# Patient Record
Sex: Female | Born: 1980 | Hispanic: Yes | Marital: Married | Smoking: Never smoker
Health system: Southern US, Community
[De-identification: ages and names within clinical notes are randomized; demographics above are authoritative.]

## PROBLEM LIST (undated history)

## (undated) HISTORY — PX: WISDOM TOOTH EXTRACTION: SHX21

## (undated) HISTORY — PX: APPENDECTOMY: SHX54

---

## 2017-05-29 ENCOUNTER — Ambulatory Visit: Payer: 59 | Admitting: Physical Therapy

## 2017-06-04 ENCOUNTER — Ambulatory Visit: Payer: 59 | Attending: Orthopedic Surgery | Admitting: Physical Therapy

## 2017-06-04 DIAGNOSIS — M62830 Muscle spasm of back: Secondary | ICD-10-CM | POA: Diagnosis present

## 2017-06-04 DIAGNOSIS — M545 Low back pain, unspecified: Secondary | ICD-10-CM

## 2017-06-04 NOTE — Therapy (Signed)
[facility] [address] [place], [place], [address] Phone: [phone]   Fax:  [phone]  Physical Therapy Evaluation  Patient Details  Name: Elizabeth Acevedo MRN: [id number] Date of Birth: [birth year] Referring Provider: Elly Modena  Encounter Date: 06/04/2017      PT End of Session - 06/04/17 1559    Visit Number 1   Date for PT Re-Evaluation 08/04/17   PT Start Time 1528   PT Stop Time 1607   PT Time Calculation (min) 39 min   Activity Tolerance Patient tolerated treatment well   Behavior During Therapy [facility] for tasks assessed/performed      No past medical history on file.  No past surgical history on file.  There were no vitals filed for this visit.       Subjective Assessment - 06/04/17 1529    Subjective Patient reports low back pain started about a month ago, she does have a 38 year old that she lifts often.  She saw a chiropractor and this not help, she reports that she started prednisone and baclofen and that helped all of her pain.   Patient Stated Goals be healthy, have no pain   Currently in Pain? Yes   Pain Score 2    Pain Location Back   Pain Orientation Lower   Pain Descriptors / Indicators Sore;Burning   Pain Type Acute pain   Pain Onset More than a month ago   Pain Frequency Intermittent   Aggravating Factors  bending and lifting, driving, pain was up to 32/44   Pain Relieving Factors prednisone and baclofen helped, nothing else helped   Effect of Pain on Daily Activities limits all ADL's            [facility] PT Assessment - 06/04/17 0001      Assessment   Medical Diagnosis LBP   Referring Provider Elly Modena   Prior Therapy saw chiropractor     Precautions   Precautions None     Balance Screen   Has the patient fallen in the past 6 months No   Has the patient had a decrease in activity level because of a fear of falling?  No   Is the patient reluctant to leave their  home because of a fear of falling?  No     Home Environment   Additional Comments has a 38 year old, housework     Prior Function   Level of Independence Independent   Vocation Full time employment   Vocation Requirements sitting at computer all day   Leisure some walking     ROM / Strength   AROM / PROM / Strength AROM;Strength     AROM   Overall AROM Comments Lumbar ROM decreased 25% without pain     Strength   Overall Strength Comments 4/5 without pain     Flexibility   Soft Tissue Assessment /Muscle Length yes   Hamstrings very tight, SLR at 60 degrees   Quadriceps very tight 8 " from buttock heel   Piriformis mild tightness     Palpation   Palpation comment very tight, non tender in the lumbar parapsinals, no rib hump     Transfers   Comments gettting up from supine causes a pinch            Objective measurements completed on examination: See above findings.                  PT  Education - 06/04/17 1557    Education provided Yes   Education Details Wms flexion and LE felxibility   Person(s) Educated Patient   Methods Explanation;Demonstration;Handout   Comprehension Verbalized understanding          PT Short Term Goals - 06/04/17 1605      PT SHORT TERM GOAL #1   Title independent with initial HEP   Time 2   Period Weeks   Status New           PT Long Term Goals - 06/04/17 1605      PT LONG TERM GOAL #1   Title no pain with getting in and out of bed   Time 8   Period Weeks   Status New     PT LONG TERM GOAL #2   Title independent with gym or advanced HEP   Time 8   Period Weeks   Status New     PT LONG TERM GOAL #3   Title understand proper posture and body mechanics   Time 8   Period Weeks   Status New                Plan - 06/04/17 1601    Clinical Impression Statement Patient reports having severe pain about a month ago, she is unsure of a cause but reports that she has a 38 year old that she lifts  often.  She was seeing a chiropractor without any benefit, she reports that she was started on PRednisone and Baclofen, and now she has very little to no pain.  Reports that getting out of bed now hurts in the right low back, ROM is slightly limited, she is very tight in the LE's, the lumbar parapsinals are very tight but non tender., seems to have some curvature of the spine but no rib hump   Clinical Presentation Evolving   Clinical Presentation due to: having severe pain until last week   Clinical Decision Making Low   Rehab Potential Good   PT Frequency 2x / week   PT Duration 8 weeks   PT Treatment/Interventions ADLs/Self Care Home Management;Electrical Stimulation;Traction;Moist Heat;Patient/family education;Therapeutic exercise;Therapeutic activities;Manual techniques   PT Next Visit Plan show proper posture and body mechanics, show gym activities for good core, will need to go slow secondary to having severe pain until 1-2 weeks ago   Consulted and Agree with Plan of Care Patient      Patient will benefit from skilled therapeutic intervention in order to improve the following deficits and impairments:  Decreased strength, Postural dysfunction, Improper body mechanics, Impaired flexibility, Pain, Increased muscle spasms, Decreased range of motion  Visit Diagnosis: Acute bilateral low back pain without sciatica - Plan: PT plan of care cert/re-cert  Muscle spasm of back - Plan: PT plan of care cert/re-cert     Problem List There are no active problems to display for this patient.   Jearld Lesch., PT 06/04/2017, 4:07 PM  [facility] [address], [place], [address] Phone: [phone]   Fax:  [phone]  Name: Elizabeth Acevedo MRN: [id number] Date of Birth: 01/19/1981

## 2017-08-20 ENCOUNTER — Ambulatory Visit (INDEPENDENT_AMBULATORY_CARE_PROVIDER_SITE_OTHER): Payer: 59

## 2017-08-20 ENCOUNTER — Encounter: Payer: Self-pay | Admitting: General Practice

## 2017-08-20 DIAGNOSIS — Z3201 Encounter for pregnancy test, result positive: Secondary | ICD-10-CM

## 2017-08-20 LAB — POCT PREGNANCY, URINE: Preg Test, Ur: POSITIVE — AB

## 2017-08-20 NOTE — Progress Notes (Signed)
Pt here today for pregnancy test.  Resulted positive.  Pt reports exact LMP 05/17/17.  EDD 02/21/18.  Pt medications and allergies verified.  Pt given OB otc medication list.  Front office to provide proof of pregnancy letter to start prenatal care.

## 2017-09-10 LAB — OB RESULTS CONSOLE ABO/RH: RH TYPE: POSITIVE

## 2017-09-10 LAB — OB RESULTS CONSOLE GBS: STREP GROUP B AG: POSITIVE

## 2017-09-10 LAB — OB RESULTS CONSOLE GC/CHLAMYDIA
Chlamydia: NEGATIVE
Gonorrhea: NEGATIVE

## 2017-09-10 LAB — OB RESULTS CONSOLE HIV ANTIBODY (ROUTINE TESTING): HIV: NONREACTIVE

## 2017-09-10 LAB — OB RESULTS CONSOLE ANTIBODY SCREEN: ANTIBODY SCREEN: NEGATIVE

## 2017-09-10 LAB — OB RESULTS CONSOLE RUBELLA ANTIBODY, IGM: Rubella: IMMUNE

## 2017-09-10 LAB — OB RESULTS CONSOLE HEPATITIS B SURFACE ANTIGEN: Hepatitis B Surface Ag: NEGATIVE

## 2017-09-10 LAB — OB RESULTS CONSOLE RPR: RPR: NONREACTIVE

## 2018-01-04 ENCOUNTER — Other Ambulatory Visit: Payer: Self-pay | Admitting: Obstetrics and Gynecology

## 2018-02-14 ENCOUNTER — Encounter (HOSPITAL_COMMUNITY): Payer: Self-pay | Admitting: *Deleted

## 2018-02-15 ENCOUNTER — Encounter (HOSPITAL_COMMUNITY): Payer: Self-pay

## 2018-02-27 NOTE — Patient Instructions (Signed)
Shalayne Betancourt  02/27/2018   Your procedure is scheduled on:  03/04/2018  Enter through the Main Entrance of [facility] at 0930 AM.  Pick up the phone at the desk and dial [phone]  Call this number if you have problems the morning of surgery:[phone]  Remember:   Do not eat food:(After Midnight) Desps de medianoche.  Do not drink clear liquids: (After Midnight) Desps de medianoche.  Take these medicines the morning of surgery with A SIP OF WATER: none   Do not wear jewelry, make-up or nail polish.  Do not wear lotions, powders, or perfumes. Do not wear deodorant.  Do not shave 48 hours prior to surgery.  Do not bring valuables to the hospital.  [facility] is not   responsible for any belongings or valuables brought to the hospital.  Contacts, dentures or bridgework may not be worn into surgery.  Leave suitcase in the car. After surgery it may be brought to your room.  For patients admitted to the hospital, checkout time is 11:00 AM the day of              discharge.    N/A   Please read over the following fact sheets that you were given:   Surgical Site Infection Prevention

## 2018-03-01 ENCOUNTER — Encounter (HOSPITAL_COMMUNITY)
Admission: RE | Admit: 2018-03-01 | Discharge: 2018-03-01 | Disposition: A | Discharge status: Home / Self Care | Payer: 59 | Source: Ambulatory Visit | Attending: Obstetrics and Gynecology | Admitting: Obstetrics and Gynecology

## 2018-03-01 LAB — CBC
HEMATOCRIT: 34.5 % — AB (ref 36.0–46.0)
HEMOGLOBIN: 11.2 g/dL — AB (ref 12.0–15.0)
MCH: 27.8 pg (ref 26.0–34.0)
MCHC: 32.5 g/dL (ref 30.0–36.0)
MCV: 85.6 fL (ref 78.0–100.0)
Platelets: 179 10*3/uL (ref 150–400)
RBC: 4.03 MIL/uL (ref 3.87–5.11)
RDW: 15.5 % (ref 11.5–15.5)
WBC: 8.4 10*3/uL (ref 4.0–10.5)

## 2018-03-01 LAB — ABO/RH: ABO/RH(D): O POS

## 2018-03-02 LAB — RPR: RPR: NONREACTIVE

## 2018-03-04 ENCOUNTER — Other Ambulatory Visit: Payer: Self-pay

## 2018-03-04 ENCOUNTER — Inpatient Hospital Stay (HOSPITAL_COMMUNITY)
Admission: AD | Admit: 2018-03-04 | Discharge: 2018-03-07 | DRG: 784 | Disposition: A | Discharge status: Home / Self Care | Payer: 59 | Attending: Obstetrics and Gynecology | Admitting: Obstetrics and Gynecology

## 2018-03-04 ENCOUNTER — Inpatient Hospital Stay (HOSPITAL_COMMUNITY): Payer: 59 | Admitting: Anesthesiology

## 2018-03-04 ENCOUNTER — Encounter (HOSPITAL_COMMUNITY)
Admission: AD | Disposition: A | Discharge status: Home / Self Care | Payer: Self-pay | Source: Home / Self Care | Attending: Obstetrics and Gynecology

## 2018-03-04 ENCOUNTER — Encounter (HOSPITAL_COMMUNITY): Payer: Self-pay | Admitting: Obstetrics

## 2018-03-04 DIAGNOSIS — O99824 Streptococcus B carrier state complicating childbirth: Secondary | ICD-10-CM | POA: Diagnosis present

## 2018-03-04 DIAGNOSIS — Z302 Encounter for sterilization: Secondary | ICD-10-CM

## 2018-03-04 DIAGNOSIS — O34211 Maternal care for low transverse scar from previous cesarean delivery: Principal | ICD-10-CM | POA: Diagnosis present

## 2018-03-04 DIAGNOSIS — Z3A39 39 weeks gestation of pregnancy: Secondary | ICD-10-CM

## 2018-03-04 DIAGNOSIS — Z98891 History of uterine scar from previous surgery: Secondary | ICD-10-CM

## 2018-03-04 HISTORY — PX: TUBAL LIGATION: SHX77

## 2018-03-04 LAB — POCT I-STAT EG7
Acid-base deficit: 4 mmol/L — ABNORMAL HIGH (ref 0.0–2.0)
BICARBONATE: 22.2 mmol/L (ref 20.0–28.0)
Calcium, Ion: 1.22 mmol/L (ref 1.15–1.40)
HCT: 34 % — ABNORMAL LOW (ref 36.0–46.0)
HEMOGLOBIN: 11.6 g/dL — AB (ref 12.0–15.0)
O2 Saturation: 100 %
POTASSIUM: 4.6 mmol/L (ref 3.5–5.1)
Sodium: 135 mmol/L (ref 135–145)
TCO2: 24 mmol/L (ref 22–32)
pCO2, Ven: 42.9 mmHg — ABNORMAL LOW (ref 44.0–60.0)
pH, Ven: 7.322 (ref 7.250–7.430)
pO2, Ven: 261 mmHg — ABNORMAL HIGH (ref 32.0–45.0)

## 2018-03-04 LAB — CBC
HCT: 35.3 % — ABNORMAL LOW (ref 36.0–46.0)
HCT: 34.9 % — ABNORMAL LOW (ref 36.0–46.0)
HEMOGLOBIN: 11.3 g/dL — AB (ref 12.0–15.0)
Hemoglobin: 11.4 g/dL — ABNORMAL LOW (ref 12.0–15.0)
MCH: 27.3 pg (ref 26.0–34.0)
MCH: 27.7 pg (ref 26.0–34.0)
MCHC: 32 g/dL (ref 30.0–36.0)
MCHC: 32.7 g/dL (ref 30.0–36.0)
MCV: 85.3 fL (ref 78.0–100.0)
MCV: 84.7 fL (ref 78.0–100.0)
PLATELETS: 159 10*3/uL (ref 150–400)
Platelets: 161 10*3/uL (ref 150–400)
RBC: 4.14 MIL/uL (ref 3.87–5.11)
RBC: 4.12 MIL/uL (ref 3.87–5.11)
RDW: 15.4 % (ref 11.5–15.5)
RDW: 15.2 % (ref 11.5–15.5)
WBC: 12.2 10*3/uL — ABNORMAL HIGH (ref 4.0–10.5)
WBC: 16.4 10*3/uL — ABNORMAL HIGH (ref 4.0–10.5)

## 2018-03-04 LAB — PREPARE RBC (CROSSMATCH)

## 2018-03-04 LAB — PROTIME-INR
INR: 0.97
PROTHROMBIN TIME: 12.8 s (ref 11.4–15.2)

## 2018-03-04 SURGERY — Surgical Case
Anesthesia: Spinal | Site: Abdomen | Wound class: Clean Contaminated

## 2018-03-04 MED ORDER — METOCLOPRAMIDE HCL 5 MG/ML IJ SOLN
INTRAMUSCULAR | Status: DC | PRN
  Administered 2018-03-04: 10 mg via INTRAVENOUS

## 2018-03-04 MED ORDER — OXYTOCIN 10 UNIT/ML IJ SOLN
INTRAMUSCULAR | Status: AC
  Filled 2018-03-04: qty 4

## 2018-03-04 MED ORDER — NALBUPHINE HCL 10 MG/ML IJ SOLN
INTRAMUSCULAR | Status: AC
  Filled 2018-03-04: qty 1

## 2018-03-04 MED ORDER — CEFAZOLIN SODIUM-DEXTROSE 1-4 GM-%(50ML) IV SOLR: 2.0000 g | Freq: Once | INTRAVENOUS | Status: DC

## 2018-03-04 MED ORDER — METOCLOPRAMIDE HCL 5 MG/ML IJ SOLN
INTRAMUSCULAR | Status: AC
  Filled 2018-03-04: qty 2

## 2018-03-04 MED ORDER — PROMETHAZINE HCL 25 MG/ML IJ SOLN: 6.2500 mg | INTRAMUSCULAR | Status: DC | PRN

## 2018-03-04 MED ORDER — LACTATED RINGERS IV SOLN
INTRAVENOUS | Status: DC
  Administered 2018-03-04 (×4): via INTRAVENOUS

## 2018-03-04 MED ORDER — OXYCODONE-ACETAMINOPHEN 5-325 MG PO TABS
1.0000 | ORAL_TABLET | ORAL | Status: DC | PRN
  Administered 2018-03-05 – 2018-03-06 (×4): 1 via ORAL
  Filled 2018-03-04 (×2): qty 1

## 2018-03-04 MED ORDER — CEFAZOLIN SODIUM-DEXTROSE 2-4 GM/100ML-% IV SOLN
2.0000 g | INTRAVENOUS | Status: AC
  Administered 2018-03-04: 2 g via INTRAVENOUS
  Filled 2018-03-04: qty 100

## 2018-03-04 MED ORDER — ZOLPIDEM TARTRATE 5 MG PO TABS: 5.0000 mg | ORAL_TABLET | Freq: Every evening | ORAL | Status: DC | PRN

## 2018-03-04 MED ORDER — ROCURONIUM BROMIDE 100 MG/10ML IV SOLN
INTRAVENOUS | Status: AC
Start: 2018-03-04 — End: ?
  Filled 2018-03-04: qty 1

## 2018-03-04 MED ORDER — SCOPOLAMINE 1 MG/3DAYS TD PT72
MEDICATED_PATCH | TRANSDERMAL | Status: AC
  Administered 2018-03-04: 1.5 mg via TRANSDERMAL
  Filled 2018-03-04: qty 1

## 2018-03-04 MED ORDER — ONDANSETRON HCL 4 MG/2ML IJ SOLN
INTRAMUSCULAR | Status: DC | PRN
  Administered 2018-03-04: 4 mg via INTRAVENOUS

## 2018-03-04 MED ORDER — ACETAMINOPHEN 10 MG/ML IV SOLN
INTRAVENOUS | Status: AC
  Administered 2018-03-04: 1000 mg
  Filled 2018-03-04: qty 100

## 2018-03-04 MED ORDER — SUCCINYLCHOLINE CHLORIDE 20 MG/ML IJ SOLN
INTRAMUSCULAR | Status: DC | PRN
  Administered 2018-03-04: 100 mg via INTRAVENOUS

## 2018-03-04 MED ORDER — METHYLERGONOVINE MALEATE 0.2 MG/ML IJ SOLN
INTRAMUSCULAR | Status: DC | PRN
  Administered 2018-03-04: 0.2 mg via INTRAMUSCULAR

## 2018-03-04 MED ORDER — ONDANSETRON HCL 4 MG/2ML IJ SOLN
INTRAMUSCULAR | Status: AC
  Filled 2018-03-04: qty 2

## 2018-03-04 MED ORDER — ACETAMINOPHEN 10 MG/ML IV SOLN
1000.0000 mg | Freq: Four times a day (QID) | INTRAVENOUS | Status: DC
  Filled 2018-03-04 (×4): qty 100

## 2018-03-04 MED ORDER — SODIUM CHLORIDE 0.9% FLUSH
INTRAVENOUS | Status: AC
  Filled 2018-03-04: qty 3

## 2018-03-04 MED ORDER — CARBOPROST TROMETHAMINE 250 MCG/ML IM SOLN
INTRAMUSCULAR | Status: AC
  Filled 2018-03-04: qty 1

## 2018-03-04 MED ORDER — OXYTOCIN 10 UNIT/ML IJ SOLN
INTRAVENOUS | Status: DC | PRN
  Administered 2018-03-04: 40 [IU] via INTRAVENOUS

## 2018-03-04 MED ORDER — SUGAMMADEX SODIUM 500 MG/5ML IV SOLN
INTRAVENOUS | Status: AC
  Filled 2018-03-04: qty 5

## 2018-03-04 MED ORDER — COCONUT OIL OIL: 1.0000 "application " | TOPICAL_OIL | Status: DC | PRN

## 2018-03-04 MED ORDER — PROPOFOL 10 MG/ML IV BOLUS
INTRAVENOUS | Status: DC | PRN
  Administered 2018-03-04: 200 mg via INTRAVENOUS

## 2018-03-04 MED ORDER — BUPIVACAINE IN DEXTROSE 0.75-8.25 % IT SOLN
INTRATHECAL | Status: DC | PRN
  Administered 2018-03-04: 1.4 mL via INTRATHECAL

## 2018-03-04 MED ORDER — MORPHINE SULFATE (PF) 4 MG/ML IV SOLN
INTRAVENOUS | Status: AC
  Filled 2018-03-04: qty 1

## 2018-03-04 MED ORDER — MORPHINE SULFATE (PF) 0.5 MG/ML IJ SOLN
INTRAMUSCULAR | Status: AC
Start: 2018-03-04 — End: ?
  Filled 2018-03-04: qty 10

## 2018-03-04 MED ORDER — OXYTOCIN 40 UNITS IN LACTATED RINGERS INFUSION - SIMPLE MED: 2.5000 [IU]/h | INTRAVENOUS | Status: AC

## 2018-03-04 MED ORDER — BUPIVACAINE HCL (PF) 0.5 % IJ SOLN
30.0000 mL | Freq: Once | INTRAMUSCULAR | Status: DC
  Filled 2018-03-04: qty 30

## 2018-03-04 MED ORDER — SUCCINYLCHOLINE CHLORIDE 200 MG/10ML IV SOSY
PREFILLED_SYRINGE | INTRAVENOUS | Status: AC
  Filled 2018-03-04: qty 10

## 2018-03-04 MED ORDER — SUGAMMADEX SODIUM 200 MG/2ML IV SOLN
INTRAVENOUS | Status: DC | PRN
  Administered 2018-03-04: 150 mg via INTRAVENOUS

## 2018-03-04 MED ORDER — LACTATED RINGERS IV SOLN: INTRAVENOUS | Status: DC

## 2018-03-04 MED ORDER — METHYLERGONOVINE MALEATE 0.2 MG PO TABS
ORAL_TABLET | ORAL | Status: AC
  Filled 2018-03-04: qty 1

## 2018-03-04 MED ORDER — FENTANYL CITRATE (PF) 100 MCG/2ML IJ SOLN
INTRAMUSCULAR | Status: AC
Start: 2018-03-04 — End: ?
  Filled 2018-03-04: qty 2

## 2018-03-04 MED ORDER — DIBUCAINE 1 % RE OINT: 1.0000 "application " | TOPICAL_OINTMENT | RECTAL | Status: DC | PRN

## 2018-03-04 MED ORDER — MIDAZOLAM HCL 2 MG/2ML IJ SOLN: 0.5000 mg | Freq: Once | INTRAMUSCULAR | Status: DC | PRN

## 2018-03-04 MED ORDER — SENNOSIDES-DOCUSATE SODIUM 8.6-50 MG PO TABS
2.0000 | ORAL_TABLET | ORAL | Status: DC
  Administered 2018-03-04 – 2018-03-07 (×3): 2 via ORAL
  Filled 2018-03-04 (×3): qty 2

## 2018-03-04 MED ORDER — DEXAMETHASONE SODIUM PHOSPHATE 10 MG/ML IJ SOLN
INTRAMUSCULAR | Status: AC
  Filled 2018-03-04: qty 1

## 2018-03-04 MED ORDER — PHENYLEPHRINE 8 MG IN D5W 100 ML (0.08MG/ML) PREMIX OPTIME
INJECTION | INTRAVENOUS | Status: AC
  Filled 2018-03-04: qty 100

## 2018-03-04 MED ORDER — IBUPROFEN 600 MG PO TABS
600.0000 mg | ORAL_TABLET | Freq: Four times a day (QID) | ORAL | Status: DC
  Administered 2018-03-04 – 2018-03-06 (×6): 600 mg via ORAL
  Filled 2018-03-04 (×6): qty 1

## 2018-03-04 MED ORDER — ROCURONIUM BROMIDE 100 MG/10ML IV SOLN
INTRAVENOUS | Status: DC | PRN
  Administered 2018-03-04: 30 mg via INTRAVENOUS

## 2018-03-04 MED ORDER — DIPHENHYDRAMINE HCL 25 MG PO CAPS: 25.0000 mg | ORAL_CAPSULE | Freq: Four times a day (QID) | ORAL | Status: DC | PRN

## 2018-03-04 MED ORDER — MORPHINE SULFATE (PF) 4 MG/ML IV SOLN
1.0000 mg | INTRAVENOUS | Status: DC | PRN
  Administered 2018-03-04: 1 mg via INTRAVENOUS
  Administered 2018-03-04: 2 mg via INTRAVENOUS

## 2018-03-04 MED ORDER — FENTANYL CITRATE (PF) 100 MCG/2ML IJ SOLN
INTRAMUSCULAR | Status: AC
  Filled 2018-03-04: qty 2

## 2018-03-04 MED ORDER — ALBUMIN HUMAN 5 % IV SOLN
INTRAVENOUS | Status: DC | PRN
  Administered 2018-03-04: 14:00:00 via INTRAVENOUS

## 2018-03-04 MED ORDER — SCOPOLAMINE 1 MG/3DAYS TD PT72
1.0000 | MEDICATED_PATCH | Freq: Once | TRANSDERMAL | Status: DC
  Administered 2018-03-04: 1.5 mg via TRANSDERMAL

## 2018-03-04 MED ORDER — MENTHOL 3 MG MT LOZG: 1.0000 | LOZENGE | OROMUCOSAL | Status: DC | PRN

## 2018-03-04 MED ORDER — SODIUM CHLORIDE 0.9 % IR SOLN
Status: DC | PRN
  Administered 2018-03-04: 500 mL
  Administered 2018-03-04: 1000 mL

## 2018-03-04 MED ORDER — PROPOFOL 10 MG/ML IV BOLUS
INTRAVENOUS | Status: AC
  Filled 2018-03-04: qty 20

## 2018-03-04 MED ORDER — SIMETHICONE 80 MG PO CHEW
80.0000 mg | CHEWABLE_TABLET | Freq: Three times a day (TID) | ORAL | Status: DC
  Administered 2018-03-04 – 2018-03-07 (×6): 80 mg via ORAL
  Filled 2018-03-04 (×7): qty 1

## 2018-03-04 MED ORDER — CARBOPROST TROMETHAMINE 250 MCG/ML IM SOLN
INTRAMUSCULAR | Status: DC | PRN
  Administered 2018-03-04: 250 ug via INTRAMUSCULAR

## 2018-03-04 MED ORDER — NALBUPHINE HCL 10 MG/ML IJ SOLN
5.0000 mg | INTRAMUSCULAR | Status: DC | PRN
  Administered 2018-03-04: 5 mg via INTRAVENOUS

## 2018-03-04 MED ORDER — KETOROLAC TROMETHAMINE 30 MG/ML IJ SOLN: 30.0000 mg | Freq: Once | INTRAMUSCULAR | Status: DC | PRN

## 2018-03-04 MED ORDER — OXYCODONE-ACETAMINOPHEN 5-325 MG PO TABS
2.0000 | ORAL_TABLET | ORAL | Status: DC | PRN
  Administered 2018-03-05: 2 via ORAL
  Filled 2018-03-04 (×3): qty 2

## 2018-03-04 MED ORDER — LACTATED RINGERS IV SOLN
INTRAVENOUS | Status: DC | PRN
  Administered 2018-03-04: 14:00:00 via INTRAVENOUS

## 2018-03-04 MED ORDER — BUPIVACAINE HCL (PF) 0.5 % IJ SOLN
INTRAMUSCULAR | Status: AC
  Filled 2018-03-04: qty 30

## 2018-03-04 MED ORDER — ACETAMINOPHEN 325 MG PO TABS: 650.0000 mg | ORAL_TABLET | ORAL | Status: DC | PRN

## 2018-03-04 MED ORDER — SOD CITRATE-CITRIC ACID 500-334 MG/5ML PO SOLN
ORAL | Status: AC
  Administered 2018-03-04: 30 mL via ORAL
  Filled 2018-03-04: qty 15

## 2018-03-04 MED ORDER — CEFAZOLIN SODIUM-DEXTROSE 2-4 GM/100ML-% IV SOLN
2.0000 g | Freq: Once | INTRAVENOUS | Status: AC
  Administered 2018-03-04: 2 g via INTRAVENOUS
  Filled 2018-03-04: qty 100

## 2018-03-04 MED ORDER — SIMETHICONE 80 MG PO CHEW
80.0000 mg | CHEWABLE_TABLET | ORAL | Status: DC
  Administered 2018-03-04 – 2018-03-05 (×2): 80 mg via ORAL
  Filled 2018-03-04 (×2): qty 1

## 2018-03-04 MED ORDER — DEXAMETHASONE SODIUM PHOSPHATE 10 MG/ML IJ SOLN
INTRAMUSCULAR | Status: DC | PRN
  Administered 2018-03-04: 10 mg via INTRAVENOUS

## 2018-03-04 MED ORDER — METHYLERGONOVINE MALEATE 0.2 MG PO TABS
0.2000 mg | ORAL_TABLET | Freq: Four times a day (QID) | ORAL | Status: AC
  Administered 2018-03-04 – 2018-03-05 (×4): 0.2 mg via ORAL
  Filled 2018-03-04 (×3): qty 1

## 2018-03-04 MED ORDER — MORPHINE SULFATE (PF) 0.5 MG/ML IJ SOLN
INTRAMUSCULAR | Status: DC | PRN
  Administered 2018-03-04: .2 mg via INTRATHECAL

## 2018-03-04 MED ORDER — ALBUMIN HUMAN 5 % IV SOLN
INTRAVENOUS | Status: AC
  Filled 2018-03-04: qty 250

## 2018-03-04 MED ORDER — WITCH HAZEL-GLYCERIN EX PADS: 1.0000 "application " | MEDICATED_PAD | CUTANEOUS | Status: DC | PRN

## 2018-03-04 MED ORDER — PRENATAL MULTIVITAMIN CH
1.0000 | ORAL_TABLET | Freq: Every day | ORAL | Status: DC
  Administered 2018-03-05 – 2018-03-06 (×2): 1 via ORAL
  Filled 2018-03-04 (×2): qty 1

## 2018-03-04 MED ORDER — BUPIVACAINE HCL (PF) 0.5 % IJ SOLN
INTRAMUSCULAR | Status: DC | PRN
  Administered 2018-03-04: 30 mL

## 2018-03-04 MED ORDER — OXYTOCIN 40 UNITS IN LACTATED RINGERS INFUSION - SIMPLE MED
INTRAVENOUS | Status: DC | PRN
  Administered 2018-03-04 (×2): 100 mL via INTRAVENOUS
  Administered 2018-03-04: 900 mL via INTRAVENOUS
  Administered 2018-03-04: 100 mL via INTRAVENOUS

## 2018-03-04 MED ORDER — MORPHINE SULFATE-NACL 0.5-0.9 MG/ML-% IV SOSY: PREFILLED_SYRINGE | INTRAVENOUS | Status: DC | PRN

## 2018-03-04 MED ORDER — TETANUS-DIPHTH-ACELL PERTUSSIS 5-2.5-18.5 LF-MCG/0.5 IM SUSP: 0.5000 mL | Freq: Once | INTRAMUSCULAR | Status: DC

## 2018-03-04 MED ORDER — SIMETHICONE 80 MG PO CHEW: 80.0000 mg | CHEWABLE_TABLET | ORAL | Status: DC | PRN

## 2018-03-04 MED ORDER — SOD CITRATE-CITRIC ACID 500-334 MG/5ML PO SOLN
30.0000 mL | Freq: Once | ORAL | Status: AC
  Administered 2018-03-04: 30 mL via ORAL

## 2018-03-04 MED ORDER — PHENYLEPHRINE 8 MG IN D5W 100 ML (0.08MG/ML) PREMIX OPTIME
INJECTION | INTRAVENOUS | Status: DC | PRN
  Administered 2018-03-04: 60 ug/min via INTRAVENOUS

## 2018-03-04 MED ORDER — MEPERIDINE HCL 25 MG/ML IJ SOLN: 6.2500 mg | INTRAMUSCULAR | Status: DC | PRN

## 2018-03-04 MED ORDER — TRANEXAMIC ACID 1000 MG/10ML IV SOLN
1000.0000 mg | INTRAVENOUS | Status: AC
  Administered 2018-03-04: 1000 mg via INTRAVENOUS
  Filled 2018-03-04: qty 10

## 2018-03-04 MED ORDER — SODIUM CHLORIDE 0.9% IV SOLUTION: Freq: Once | INTRAVENOUS | Status: DC

## 2018-03-04 MED ORDER — FENTANYL CITRATE (PF) 100 MCG/2ML IJ SOLN
INTRAMUSCULAR | Status: DC | PRN
  Administered 2018-03-04: 40 ug via INTRAVENOUS
  Administered 2018-03-04: 100 ug via INTRAVENOUS
  Administered 2018-03-04: 50 ug via INTRAVENOUS
  Administered 2018-03-04: 10 ug via INTRATHECAL

## 2018-03-04 SURGICAL SUPPLY — 59 items
BENZOIN TINCTURE PRP APPL 2/3 (GAUZE/BANDAGES/DRESSINGS) ×4 IMPLANT
CHLORAPREP W/TINT 26ML (MISCELLANEOUS) ×4 IMPLANT
CLAMP CORD UMBIL (MISCELLANEOUS) IMPLANT
CLOSURE STERI STRIP 1/2 X4 (GAUZE/BANDAGES/DRESSINGS) ×4 IMPLANT
CLOSURE WOUND 1/2 X4 (GAUZE/BANDAGES/DRESSINGS) ×2
CLOTH BEACON ORANGE TIMEOUT ST (SAFETY) ×4 IMPLANT
COUNTER NEEDLE 1200 MAGNETIC (NEEDLE) ×4 IMPLANT
DECANTER SPIKE VIAL GLASS SM (MISCELLANEOUS) ×4 IMPLANT
DRAIN JACKSON PRT FLT 10 (DRAIN) IMPLANT
DRSG OPSITE POSTOP 4X10 (GAUZE/BANDAGES/DRESSINGS) ×4 IMPLANT
ELECT REM PT RETURN 9FT ADLT (ELECTROSURGICAL) ×4
ELECTRODE REM PT RTRN 9FT ADLT (ELECTROSURGICAL) ×2 IMPLANT
EVACUATOR SILICONE 100CC (DRAIN) IMPLANT
EXTRACTOR VACUUM M CUP 4 TUBE (SUCTIONS) IMPLANT
EXTRACTOR VACUUM M CUP 4' TUBE (SUCTIONS)
GAUZE SPONGE 4X4 12PLY STRL LF (GAUZE/BANDAGES/DRESSINGS) ×8 IMPLANT
GLOVE BIO SURGEON STRL SZ 6.5 (GLOVE) ×6 IMPLANT
GLOVE BIO SURGEON STRL SZ7.5 (GLOVE) ×4 IMPLANT
GLOVE BIO SURGEONS STRL SZ 6.5 (GLOVE) ×2
GLOVE BIOGEL PI IND STRL 7.0 (GLOVE) ×6 IMPLANT
GLOVE BIOGEL PI INDICATOR 7.0 (GLOVE) ×6
GOWN STRL REUS W/TWL LRG LVL3 (GOWN DISPOSABLE) ×8 IMPLANT
HEMOSTAT ARISTA ABSORB 3G PWDR (MISCELLANEOUS) ×4 IMPLANT
IV SOD CHL 0.9% 1000ML (IV SOLUTION) ×12 IMPLANT
KIT ABG SYR 3ML LUER SLIP (SYRINGE) IMPLANT
NDL SAFETY ECLIPSE 18X1.5 (NEEDLE) ×2 IMPLANT
NEEDLE HYPO 18GX1.5 SHARP (NEEDLE) ×2
NEEDLE HYPO 25X5/8 SAFETYGLIDE (NEEDLE) IMPLANT
NS IRRIG 1000ML POUR BTL (IV SOLUTION) ×4 IMPLANT
PACK C SECTION WH (CUSTOM PROCEDURE TRAY) ×4 IMPLANT
PAD ABD 7.5X8 STRL (GAUZE/BANDAGES/DRESSINGS) ×4 IMPLANT
PAD ABD 8X10 STRL (GAUZE/BANDAGES/DRESSINGS) ×4 IMPLANT
PAD OB MATERNITY 4.3X12.25 (PERSONAL CARE ITEMS) ×4 IMPLANT
PENCIL SMOKE EVAC W/HOLSTER (ELECTROSURGICAL) ×4 IMPLANT
RTRCTR C-SECT PINK 25CM LRG (MISCELLANEOUS) IMPLANT
SPONGE GAUZE 4X4 16PLY NONSTR (GAUZE/BANDAGES/DRESSINGS) ×8 IMPLANT
SPONGE LAP 18X18 X RAY DECT (DISPOSABLE) ×20 IMPLANT
STRIP CLOSURE SKIN 1/2X4 (GAUZE/BANDAGES/DRESSINGS) ×6 IMPLANT
SUT CHROMIC 0 CT 1 (SUTURE) ×12 IMPLANT
SUT CHROMIC 2 0 CT 1 (SUTURE) ×8 IMPLANT
SUT MNCRL AB 3-0 PS2 27 (SUTURE) ×4 IMPLANT
SUT PLAIN 2 0 (SUTURE) ×4
SUT PLAIN 2 0 XLH (SUTURE) ×4 IMPLANT
SUT PLAIN ABS 2-0 CT1 27XMFL (SUTURE) ×4 IMPLANT
SUT SILK 2 0 SH (SUTURE) IMPLANT
SUT VIC AB 0 CT1 18XCR BRD8 (SUTURE) ×2 IMPLANT
SUT VIC AB 0 CT1 27 (SUTURE) ×16
SUT VIC AB 0 CT1 27XBRD ANBCTR (SUTURE) ×16 IMPLANT
SUT VIC AB 0 CT1 36 (SUTURE) ×4 IMPLANT
SUT VIC AB 0 CT1 8-18 (SUTURE) ×2
SUT VIC AB 0 CTX 36 (SUTURE) ×20
SUT VIC AB 0 CTX36XBRD ANBCTRL (SUTURE) ×20 IMPLANT
SUT VIC AB 2-0 CT1 (SUTURE) ×12 IMPLANT
SUT VIC AB 2-0 SH 27 (SUTURE) ×12
SUT VIC AB 2-0 SH 27XBRD (SUTURE) ×12 IMPLANT
SUT VICRYL 0 TIES 12 18 (SUTURE) ×4 IMPLANT
SYR 10ML LL (SYRINGE) ×4 IMPLANT
TOWEL OR 17X24 6PK STRL BLUE (TOWEL DISPOSABLE) ×4 IMPLANT
TRAY FOLEY W/BAG SLVR 14FR LF (SET/KITS/TRAYS/PACK) ×4 IMPLANT

## 2018-03-04 NOTE — Anesthesia Postprocedure Evaluation (Signed)
Anesthesia Post Note  Patient: Shelda Betancourt-Serrano  Procedure(s) Performed: REPEAT CESAREAN SECTION (N/A Abdomen) BILATERAL TUBAL LIGATION (Bilateral Abdomen)     Patient location during evaluation: PACU Anesthesia Type: Spinal and General Level of consciousness: oriented, sedated and patient cooperative Pain management: pain level controlled Vital Signs Assessment: post-procedure vital signs reviewed and stable Respiratory status: spontaneous breathing, nonlabored ventilation, respiratory function stable and patient connected to nasal cannula oxygen Cardiovascular status: blood pressure returned to baseline and stable Postop Assessment: no apparent nausea or vomiting Anesthetic complications: no    Last Vitals:  Vitals:   03/04/18 1645 03/04/18 1700  BP: 103/73 110/73  Pulse: 67 81  Resp: 15 16  Temp:    SpO2: 97% 96%    Last Pain:  Vitals:   03/04/18 1700  TempSrc:   PainSc: 3    Pain Goal:    LLE Motor Response: Purposeful movement (03/04/18 1700) LLE Sensation: Tingling (03/04/18 1700) RLE Motor Response: Purposeful movement (03/04/18 1700) RLE Sensation: Tingling (03/04/18 1700)      Elsie Sakuma,E. Edin Skarda

## 2018-03-04 NOTE — H&P (Signed)
Elizabeth Acevedo is a 37 y.o. female G3P2002  677w0d  presenting for Repeat C/S  With bilateral tubal ligation OB History    Gravida  3   Para  2   Term  2   Preterm      AB      Living  2     SAB      TAB      Ectopic      Multiple      Live Births  2          No past medical history on file. Past Surgical History:  Procedure Laterality Date  . APPENDECTOMY    . CESAREAN SECTION    . WISDOM TOOTH EXTRACTION     Family History: family history is not on file. Social History:  reports that she has never smoked. She has never used smokeless tobacco. She reports that she does not drink alcohol or use drugs.     Maternal Diabetes: No Genetic Screening: Normal Maternal Ultrasounds/Referrals: Normal Fetal Ultrasounds or other Referrals:  None Maternal Substance Abuse:  No Significant Maternal Medications:  None Significant Maternal Lab Results:  Lab values include: Group B Strep positive Other Comments:  None  Review of Systems  All other systems reviewed and are negative.  Maternal Medical History:  Fetal activity: Perceived fetal activity is normal.    Prenatal Complications - Diabetes: none.      Blood pressure 98/63, pulse 88, temperature 97.9 F (36.6 C), temperature source Oral, resp. rate 20, height 5' (1.524 m), weight 170 lb 1.6 oz (77.2 kg), last menstrual period 06/04/2017. Maternal Exam:  Uterine Assessment: Contraction frequency is rare.   Abdomen: Patient reports no abdominal tenderness. Fundal height is 7 pounds.   Fetal presentation: vertex  Introitus: not evaluated.   Cervix: not evaluated.   Physical Exam  Nursing note and vitals reviewed. Constitutional: She is oriented to person, place, and time. She appears well-developed and well-nourished. No distress.  HENT:  Head: Normocephalic and atraumatic.  Neck: Normal range of motion.  Cardiovascular: Normal rate.  Respiratory: Effort normal and breath sounds normal.   GI: Soft.  Neurological: She is alert and oriented to person, place, and time.  Skin: Skin is warm and dry.  Psychiatric: She has a normal mood and affect. Her behavior is normal. Thought content normal.    Prenatal labs: ABO, Rh: --/--/O POS, O POS Performed at [facility], [address] [place], [place] [address]  [phone](06/28 1120) Antibody: NEG (06/28 1120) Rubella: Immune (01/07 0000) RPR: Non Reactive (06/28 1120)  HBsAg: Negative (01/07 0000)  HIV: Non-reactive (01/07 0000)  GBS: Positive (01/07 0000)   Assessment/Plan: Scheduled Repeat C/S with Bilateral Tubal Ligation Routine Orders Declines circumcision for boy baby    Lawson FiscalLori A Clemmons CNM 03/04/2018, 11:45 AM

## 2018-03-04 NOTE — Anesthesia Preprocedure Evaluation (Addendum)
Anesthesia Evaluation  Patient identified by MRN, date of birth, ID band Patient awake    Reviewed: Allergy & Precautions, NPO status , Patient's Chart, lab work & pertinent test results  History of Anesthesia Complications Negative for: history of anesthetic complications  Airway Mallampati: II  TM Distance: >3 FB Neck ROM: Full    Dental  (+) Dental Advisory Given   Pulmonary neg pulmonary ROS,    breath sounds clear to auscultation       Cardiovascular negative cardio ROS   Rhythm:Regular Rate:Normal     Neuro/Psych negative neurological ROS     GI/Hepatic Neg liver ROS, GERD  ,  Endo/Other  negative endocrine ROS  Renal/GU negative Renal ROS     Musculoskeletal   Abdominal   Peds  Hematology plt 179k   Anesthesia Other Findings   Reproductive/Obstetrics (+) Pregnancy                            Anesthesia Physical Anesthesia Plan  ASA: II  Anesthesia Plan: Spinal   Post-op Pain Management:    Induction:   PONV Risk Score and Plan: 2 and Treatment may vary due to age or medical condition, Ondansetron, Dexamethasone and Scopolamine patch - Pre-op  Airway Management Planned: Natural Airway  Additional Equipment:   Intra-op Plan:   Post-operative Plan:   Informed Consent: I have reviewed the patients History and Physical, chart, labs and discussed the procedure including the risks, benefits and alternatives for the proposed anesthesia with the patient or authorized representative who has indicated his/her understanding and acceptance.   Dental advisory given  Plan Discussed with: CRNA and Surgeon  Anesthesia Plan Comments: (Plan routine monitors, SAB)       Anesthesia Quick Evaluation

## 2018-03-04 NOTE — Anesthesia Procedure Notes (Signed)
Spinal  Patient location during procedure: OR End time: 03/04/2018 12:25 PM Staffing Anesthesiologist: Jairo BenJackson, Sonu Kruckenberg, MD Performed: anesthesiologist  Preanesthetic Checklist Completed: patient identified, surgical consent, pre-op evaluation, timeout performed, IV checked, risks and benefits discussed and monitors and equipment checked Spinal Block Patient position: sitting Prep: site prepped and draped and DuraPrep Patient monitoring: blood pressure, continuous pulse ox, cardiac monitor and heart rate Approach: midline Location: L4-5 Injection technique: single-shot Needle Needle type: Pencan  Needle gauge: 24 G Needle length: 9 cm Additional Notes Pt identified in Operating room.  Monitors applied. Working IV access confirmed. Sterile prep, drape lumbar spine.  1% lido local L 3,4.  #24ga Pencan into clear CSF L 3,4.  10.5mg  0.75% Bupivacaine with dextrose, fentanyl, morphine injected with asp CSF beginning and end of injection.  Patient asymptomatic, VSS, no heme aspirated, tolerated well.  Sandford Craze Trayvon Trumbull, MD

## 2018-03-04 NOTE — Anesthesia Procedure Notes (Signed)
Procedure Name: Intubation Date/Time: 03/04/2018 2:09 PM Performed by: Graciela HusbandsFussell, Kengo Sturges O, CRNA Pre-anesthesia Checklist: Patient identified, Patient being monitored, Timeout performed, Emergency Drugs available and Suction available Patient Re-evaluated:Patient Re-evaluated prior to induction Oxygen Delivery Method: Circle System Utilized Preoxygenation: Pre-oxygenation with 100% oxygen Induction Type: IV induction, Rapid sequence and Cricoid Pressure applied Laryngoscope Size: 3 and Glidescope (LoPro 3   by Estell Harpin. Jaclson MD) Grade View: Grade I Tube type: Oral Tube size: 7.0 mm Number of attempts: 1 Airway Equipment and Method: stylet and Video-laryngoscopy Placement Confirmation: ETT inserted through vocal cords under direct vision,  positive ETCO2 and breath sounds checked- equal and bilateral Secured at: 21 cm Tube secured with: Tape Dental Injury: Teeth and Oropharynx as per pre-operative assessment

## 2018-03-04 NOTE — Transfer of Care (Signed)
Immediate Anesthesia Transfer of Care Note  Patient: Elizabeth Acevedo  Procedure(s) Performed: REPEAT CESAREAN SECTION (N/A Abdomen) BILATERAL TUBAL LIGATION (Bilateral Abdomen)  Patient Location: PACU  Anesthesia Type:General and Spinal  Level of Consciousness: awake, alert  and oriented  Airway & Oxygen Therapy: Patient Spontanous Breathing and Patient connected to nasal cannula oxygen  Post-op Assessment: Report given to RN and Post -op Vital signs reviewed and stable  Post vital signs: Reviewed and stable  Last Vitals:  Vitals Value Taken Time  BP    Temp    Pulse 85 03/04/2018  3:45 PM  Resp 16 03/04/2018  3:45 PM  SpO2 99 % 03/04/2018  3:45 PM  Vitals shown include unvalidated device data.  Last Pain:  Vitals:   03/04/18 1535  TempSrc:   PainSc: 5          Complications: No apparent anesthesia complications

## 2018-03-04 NOTE — Op Note (Signed)
Preoperative diagnosis : term desires repeat cesarean section and bilateral salpingectomy Postoperative diagnosis: Is the same with postpartum hemorrhage and uterine atony. Procedure is repeat lower transverse cesarean section and bilateral salpingectomy Repair bleeding mesal salpinx Surgeon is Dr. Normand Sloopillard Anesthesia spinal and then general Assistants:  Peggyann JubaLori Clemons CMA and Dr. Debroah LoopArnold M.D. EBL:  1716cc UO 200 mL clear urine IV fluids 2700 mL crystalloid Complications were bleeding in the meso- salpinx and uterine atony Procedure in detail: Patient was taken to the operating room prepped and draped in the normal sterile fashion. SCD hose and Foley catheter was placed. Patient received 2 g of Ancef. A Pfannenstiel skin incision was made with the knife over her previous incision and carried down to the fascia. Fascia was incised in the midline and extended bilaterally using Mayo scissors. kokers times two placed on the superior aspect of the fascia which was dissected off the rectus muscle sharply using Mayo scissors. Lower  Aspect of the fascia was dissected in a similar fashion. Peritoneum was identified tented up with hemostats and entered sharply with Metzenbaum scissors and extended superiorly and inferiorly with good visualization of bowel and bladder. The Jon Gillslexis was placed there were no adhesions holding it up. Bladder flap was created with Metzenbaum scissors. A low transverse uterine incision was made with scalpel and extended bluntly. Clear fluid was noted. Infant was delivered without difficulty  cord around the feet. apgars  were 9 and 9 placenta was delivered without difficulty the uterus was cleared of all clot and debris. Uterine incision was closed with 0 Vicryl in a running lock fashion. Second layer of 0 Vicryl was used to imbricate the uterus. The patient's left fallopian tube was grasped with Babcock clamps. And followed to the uterus. Using Kelly's along the tube. The meso-l salpinx was  sutured with 2-0 Vicryl. And the tube was removed with Metzenbaum scissors. Hemostasis was noted. The patient's right fallopian tube was grasped in a similar fashion.After the tube was removed there was noted to be bleeding in the mesal salpinx. Was clamped and made hemostatic with figure-of-eight 2-0 Vicryl suture. The area continued to bleed. In the areas on the uterus started to bleed where sutures were placed. After placing several sutures using Bovie cautery and Arista I asked  for another assistant for help. . Dr. Debroah LoopArnold came to assist.  several figure-of-eight sutures were placed around the bleeding area on the right side of the uterus is right fundal and right mesal salpinx.He is also noted to be boggy. The patient was given Hemabate and Pitocin IV and inside the uterus methergine and a TX 8. Finally after several more sutures were placed in the medicines kicked in hemostasis was noted.The uterus had been exteriorized once the mesal salpinx started bleeding. To extend the fascial incisions seem to placed uterus back into the abdominal cavity. Irrigation was done and hemostasis noted in the final areas of interest were placed along the uterine incision and along the area that was bleeding. The peritoneum was closed with 0 chromic. The muscles were irrigated and noted to be hemostatic. Fascia was closed with 0 right Vicryl in 2 using 2 suture. The subcutaneous tissue was irrigated and noted to be hemostatic. Hopefully was used to reaproximate the subcutaneous tissue.It was closed with 3-0 Monocryl subcutaneous fashion.  During the procedure the patient's hemoglobin was checked and is still said to be 11 which we felt like she was dry she was given extra fluid and albumin she remained stable throughout the  entire case. Check hemoglobin in PACU and another one 4 hours from now.We will watch the patient very closely she also will be given another 2 g of Ancef physical blood loss.

## 2018-03-05 LAB — TYPE AND SCREEN
ABO/RH(D): O POS
Antibody Screen: NEGATIVE
UNIT DIVISION: 0
UNIT DIVISION: 0
Unit division: 0
Unit division: 0

## 2018-03-05 LAB — BPAM RBC
BLOOD PRODUCT EXPIRATION DATE: 201907192359
Blood Product Expiration Date: 201907212359
Blood Product Expiration Date: 201907272359
Blood Product Expiration Date: 201908022359
ISSUE DATE / TIME: 201907011416
ISSUE DATE / TIME: 201907011416
UNIT TYPE AND RH: 5100
UNIT TYPE AND RH: 9500
Unit Type and Rh: 5100
Unit Type and Rh: 9500

## 2018-03-05 LAB — CBC
HEMATOCRIT: 30.2 % — AB (ref 36.0–46.0)
Hemoglobin: 9.8 g/dL — ABNORMAL LOW (ref 12.0–15.0)
MCH: 27.6 pg (ref 26.0–34.0)
MCHC: 32.5 g/dL (ref 30.0–36.0)
MCV: 85.1 fL (ref 78.0–100.0)
Platelets: 171 10*3/uL (ref 150–400)
RBC: 3.55 MIL/uL — AB (ref 3.87–5.11)
RDW: 15.2 % (ref 11.5–15.5)
WBC: 13.4 10*3/uL — ABNORMAL HIGH (ref 4.0–10.5)

## 2018-03-05 NOTE — Lactation Note (Signed)
This note was copied from a baby's chart. Lactation Consultation Note  Patient Name: Boy Viann ShoveDamaris Betancourt-Serrano [id number]'s Date: 03/05/2018 Reason for consult: Initial assessment;Term  P3 mother whose infant is now 6312 hours old.  Mother chooses to breast/bottle feed this baby.  Infant is sleeping in bassinet as I arrived.  Mother stated, "I don't have milk yet."  I explained the colostrum and milk CTV with her.  She was initially worried that the baby was not "getting enough" from her.  Reviewed basic breastfeeding, milk supply and demand, transition from colostrum to milk, feeding 8-12 times/24 hours or earlier if he shows cues, and reassured her that she does not have to give any supplement at this time.  Encouraged breastfeeding whenever baby shows cues.  Mother verbalized understanding and was somewhat relieved to hear that colostrum was enough for him.  Mom made aware of O/P services, breastfeeding support groups, community resources, and our phone # for post-discharge questions. Mother has no support person in the room at this time.  She will call for assistance as needed.   Maternal Data Formula Feeding for Exclusion: No Has patient been taught Hand Expression?: Yes  Feeding Feeding Type: Breast Fed Length of feed: 5 min  LATCH Score Latch: Grasps breast easily, tongue down, lips flanged, rhythmical sucking.  Audible Swallowing: A few with stimulation  Type of Nipple: Everted at rest and after stimulation  Comfort (Breast/Nipple): Soft / non-tender  Hold (Positioning): No assistance needed to correctly position infant at breast.  LATCH Score: 9  Interventions    Lactation Tools Discussed/Used     Consult Status Consult Status: Follow-up Date: 03/06/18 Follow-up type: In-patient    Latessa Tillis R Romain Erion 03/05/2018, 1:52 AM

## 2018-03-05 NOTE — Anesthesia Postprocedure Evaluation (Signed)
Anesthesia Post Note  Patient: Lizzet Betancourt-Serrano  Procedure(s) Performed: REPEAT CESAREAN SECTION (N/A Abdomen) BILATERAL TUBAL LIGATION (Bilateral Abdomen)     Patient location during evaluation: Mother Baby Anesthesia Type: Combined General/Spinal Level of consciousness: awake and alert and oriented Pain management: pain level controlled Vital Signs Assessment: post-procedure vital signs reviewed and stable Respiratory status: spontaneous breathing, nonlabored ventilation and respiratory function stable Cardiovascular status: stable Postop Assessment: no headache, no backache, spinal receding, able to ambulate, adequate PO intake, no apparent nausea or vomiting and patient able to bend at knees Anesthetic complications: no    Last Vitals:  Vitals:   03/05/18 0100 03/05/18 0437  BP: 103/61 92/60  Pulse: 82 79  Resp: 18 18  Temp: 37.1 C 36.9 C  SpO2:      Last Pain:  Vitals:   03/05/18 0437  TempSrc: Axillary  PainSc: 2    Pain Goal:                 Yalitza Teed Hristova

## 2018-03-05 NOTE — Addendum Note (Signed)
Addendum  created 03/05/18 0738 by Elgie CongoMalinova, Markiesha Delia H, CRNA   Sign clinical note

## 2018-03-05 NOTE — Progress Notes (Addendum)
Subjective: Postpartum Day 1: Cesarean Delivery Patient reports incisional pain, tolerating PO and no problems voiding.    Objective: Vitals:   03/04/18 2034 03/05/18 0100 03/05/18 0437 03/05/18 0855  BP: 100/60 103/61 92/60 (!) 84/57  Pulse: 71 82 79 81  Resp: 16 18 18    Temp: 98 F (36.7 C) 98.7 F (37.1 C) 98.5 F (36.9 C) 99.3 F (37.4 C)  TempSrc: Axillary Axillary Axillary Axillary  SpO2: 100%   99%  Weight:      Height:        Physical Exam:  General: alert and cooperative Lochia: appropriate Uterine Fundus: firm Incision: healing well, no significant drainage, no dehiscence, no significant erythema DVT Evaluation: No evidence of DVT seen on physical exam. Negative Homan's sign. No cords or calf tenderness. No significant calf/ankle edema.  Recent Labs    03/04/18 1952 03/05/18 0603  HGB 11.4* 9.8*  HCT 34.9* 30.2*    Assessment/Plan: Status post Cesarean section. Doing well postoperatively.  Continue current care.  Janeece Riggersllis K Diamantina Edinger 03/05/2018, 8:57 AM I saw and examined patient at bedside and agree with above findings, assessment and plan per CNM Kathalene FramesEllis Mariona Scholes.  Honey comb dressing was changed at 11 am for some incisional drainage, on exam now I see small dried blood on right edge, no active drainage, will monitor.  Dr. Sallye OberKulwa.    I was called to beside by RN. Patient had increased drainage after removal of pressure dressing and honeycomb dressing was saturated with serosanguinous drainage. Steri strips and honeycomb dressing changed, pressure dressing reapplied and will remove in the am. Drainage appears stopped at this time.   Kristen CardinalEllis K Donavan Kerlin 1:10 PM 03/05/18

## 2018-03-05 NOTE — Progress Notes (Signed)
Elizabeth Acevedo CNM was notified of continued drainage of the incision and honeycomb appearing saturated.  Also informed that patient was requesting an MD evaluate her.  Elizabeth Acevedo stated she would pass this on to Elizabeth Acevedo and would request that the oncoming MD come to evaluate her.   Elizabeth Acevedo, Elizabeth Acevedo

## 2018-03-05 NOTE — Lactation Note (Addendum)
This note was copied from a baby's chart. Lactation Consultation Note: MD reports that infants Rt nostril is stuffy. Reports that mother needs assistance with breastfeeding infant.   Mother reports that she breastfed and supplemented with formula with her first two children and feels she never made enough milk . Discussed supply and demand. Mother given support and encouragement about her supply.   Mother very sleepy and tired. Mother had EBL of 1716 cc. Father at the bedside with a bottle of formula getting ready to feed. Mother reports that infant has not breastfed in 3 hours. Mother is concerned that she doesn't have enough milk. Mother reports that infant cluster fed during early morning hours.   Advised mother to do skin to skin . Assist with hand expression and observed multiple drops of colostrum.  Infant placed in football hold and latched on for 15-20 mins. Observed frequent suckling and swallows. Mother in and out of sleep while LC held infant at breast.    Suggested that mother nap after feeding and encouraged father to do skin to skin as needed.   Discussed mothers loss of blood and need to do post pump and setting up a DEBP for mother. Patient agreeable to post pumping.  Encouraged mother to feed infant 8-12 times in 24 hours and with feeding cues.     Patient Name: Elizabeth Viann ShoveDamaris Betancourt-Serrano ZOXWR'UToday's Date: 03/05/2018 Reason for consult: Follow-up assessment   Maternal Data    Feeding Feeding Type: Breast Fed Length of feed: 15 min  LATCH Score Latch: Grasps breast easily, tongue down, lips flanged, rhythmical sucking.  Audible Swallowing: Spontaneous and intermittent  Type of Nipple: Everted at rest and after stimulation  Comfort (Breast/Nipple): Soft / non-tender  Hold (Positioning): Assistance needed to correctly position infant at breast and maintain latch.  LATCH Score: 9  Interventions    Lactation Tools Discussed/Used     Consult Status Consult  Status: Follow-up Date: 03/06/18 Follow-up type: In-patient    Stevan BornKendrick, Rajohn Henery [facility] 03/05/2018, 3:44 PM

## 2018-03-05 NOTE — Progress Notes (Signed)
S:  Called to evaluate incision.  Dressing and honeycomb changed earlier due to increase serosanguineous drainage.  New steri strips applied and pressure dressing applies.  Honey comb saturated again. O:  Incision inspected.  Small area noted where bright red blood coming out in slow trickle.  Appears to be coming from subcutaneous tissue. Procedure  Discussed with patient and husband after consult with Dr. Dion BodyVarnado.  Arista used and inserted into gab in skin incision.  Steri strip reapplied.  Honeycomb replaced.  No further bleeding noted

## 2018-03-06 MED ORDER — PROMETHAZINE HCL 25 MG/ML IJ SOLN: 25.0000 mg | Freq: Four times a day (QID) | INTRAMUSCULAR | Status: DC | PRN

## 2018-03-06 MED ORDER — OXYCODONE-ACETAMINOPHEN 5-325 MG PO TABS
2.0000 | ORAL_TABLET | ORAL | Status: DC
  Administered 2018-03-06 – 2018-03-07 (×6): 2 via ORAL
  Filled 2018-03-06 (×6): qty 2

## 2018-03-06 MED ORDER — IBUPROFEN 800 MG PO TABS
800.0000 mg | ORAL_TABLET | Freq: Three times a day (TID) | ORAL | Status: DC
  Administered 2018-03-06 – 2018-03-07 (×4): 800 mg via ORAL
  Filled 2018-03-06 (×4): qty 1

## 2018-03-06 MED ORDER — HYDROMORPHONE HCL 1 MG/ML IJ SOLN: 1.0000 mg | INTRAMUSCULAR | Status: DC | PRN

## 2018-03-06 NOTE — Progress Notes (Signed)
Post Partum Day 2 Subjective: Patient reports severe diffuse pain 7/10.  She notes that she is very sore at her incision site  And the current pain regimen isn't helping.  She denies fever or chills. She denies chest pain,  Or shortness of breath.  She reports not passing much flatus "very little".  She has been  Ambulating without difficulty. She is tolerating po with nausea or vomiting.   Objective: Blood pressure 95/60, pulse 64, temperature 97.8 F (36.6 C), temperature source Oral, resp. rate 18, height 5' (1.524 m), weight 170 lb 1.6 oz (77.2 kg), last menstrual period 06/04/2017, SpO2 100 %, unknown if currently breastfeeding.  Physical Exam:  General: alert, cooperative and no distress Lochia: appropriate Uterine Fundus: firm Abdomen: Soft, moderate distention, +diffuse tenderness, no rebound or guarding Incision: area of serosanguinous drainage along lower right corner of honeycomb dressing,                Approx. 2cm in diameter, marked.   DVT Evaluation: No evidence of DVT seen on physical exam. Negative Homan's sign. No cords or calf tenderness.  Recent Labs    03/04/18 1952 03/05/18 0603  HGB 11.4* 9.8*  HCT 34.9* 30.2*    Assessment/Plan: 37 yo G3P3 POD #2 s/p repeat cesarean section with severe pain Will make percocet scheduled Will replace IV site as it had been infiltrated Will prescribe dilaudid IV prn for severe breakthrough pain Will increase Ibuprofen to 800mg  q 8 hrs Heating pad to incision site Will closely monitor for expansion of serosanguinous area on dressing   LOS: 2 days   Elizabeth Acevedo STACIA 03/06/2018, 10:48 AM

## 2018-03-06 NOTE — Lactation Note (Signed)
This note was copied from a baby's chart. Lactation Consultation Note:  [facility]'s Rt breast, she reports that her breast has knots in the tissue. When I arrived mother had warm compresses on the RT breast. Mother breast red due to heat.   Advised mother in using ice to decrease swelling. Mothers breast are filling and getting firm.  Assist mother with good breast massage. Mother was sat up with a DEBP. Assistance given to pump. Reviewed hand expression and observed good flow of milk. Mother fit with a #27 flange.  Mother pumping and has obtained approx 10 ml . Advised mother to do good massage , ice breast for 15 mins every 3-4 hours. Assist mother with massage and hand expression.   Mother has a tiny blood blister on the rt nipple. Mother was advised to change infants feeding positions frequently . Mother has consistently used football hold. Advised mother to use cross cradle hold and rotate positions. She was given comfort gels.   Encouraged mother to page for assistance with next feeding from staff nurse. Continue to breastfeed infant 8-12 times and with feeding cues.  Discussed S/S of Mastitis. Staff nurse to bring mother ice when she finishes pumping.   Patient Name: Elizabeth Acevedo RUEAV'WToday's Date: 03/06/2018 Reason for consult: Follow-up assessment   Maternal Data Has patient been taught Hand Expression?: Yes  Feeding    LATCH Score                   Interventions Interventions: Breast massage;Hand express;Expressed milk;DEBP;Ice  Lactation Tools Discussed/Used Pump Review: Setup, frequency, and cleaning;Milk Storage Initiated by:: Stevan BornSherry Alainah Phang RN, IBCLC Date initiated:: 03/06/18   Consult Status Consult Status: Follow-up Date: 03/07/18 Follow-up type: In-patient    Stevan BornKendrick, Cayleen Benjamin [facility] 03/06/2018, 3:18 PM

## 2018-03-07 MED ORDER — IBUPROFEN 600 MG PO TABS: 600.0000 mg | ORAL_TABLET | Freq: Four times a day (QID) | ORAL | 0 refills | Status: AC | PRN

## 2018-03-07 MED ORDER — HYDROMORPHONE HCL 2 MG PO TABS: 2.0000 mg | ORAL_TABLET | Freq: Two times a day (BID) | ORAL | 0 refills | Status: DC | PRN

## 2018-03-07 NOTE — Discharge Summary (Signed)
OB Discharge Summary     Patient Name: Elizabeth Acevedo DOB: [birth year] MRN: [id number]  Date of admission: 03/04/2018 Delivering MD: Jaymes Graff   Date of discharge: 03/07/2018  Admitting diagnosis: Prior Cesarean Section Intrauterine pregnancy: [redacted]w[redacted]d     Secondary diagnosis:  Active Problems:   S/P C-section  Discharge diagnosis: Term Pregnancy Delivered                                                                                                Post partum procedures:postpartum tubal ligation  Augmentation: n/a  Complications: None  Hospital course:  Sceduled C/S   37 y.o. yo G3P3003 at [redacted]w[redacted]d was admitted to the hospital 03/04/2018 for scheduled cesarean section with the following indication:Elective Repeat.  Membrane Rupture Time/Date: 12:55 PM ,03/04/2018   Patient delivered a Viable infant.03/04/2018  Details of operation can be found in separate operative note.  Pateint had an uncomplicated postpartum course.  She is ambulating, tolerating a regular diet, passing flatus, and urinating well. Patient is discharged home in stable condition on  03/07/18         Physical exam  Vitals:   03/05/18 2238 03/06/18 0500 03/06/18 1517 03/07/18 0515  BP: 101/62 95/60 95/64  97/64  Pulse: 86 64 68 (!) 57  Resp: 18  18 18   Temp: 98.6 F (37 C) 97.8 F (36.6 C) 98.3 F (36.8 C) 97.9 F (36.6 C)  TempSrc: Oral Oral Oral Oral  SpO2:      Weight:      Height:       General: alert, cooperative and no distress Lochia: appropriate Uterine Fundus: firm Incision: Healing well with no significant drainage, No significant erythema, scant serosanguinous drainage noted DVT Evaluation: No evidence of DVT seen on physical exam. Negative Homan's sign. No cords or calf tenderness. No significant calf/ankle edema. Labs: Lab Results  Component Value Date   WBC 13.4 (H) 03/05/2018   HGB 9.8 (L) 03/05/2018   HCT 30.2 (L) 03/05/2018   MCV 85.1 03/05/2018   PLT 171 03/05/2018    CMP Latest Ref Rng & Units 03/04/2018  Sodium 135 - 145 mmol/L 135  Potassium 3.5 - 5.1 mmol/L 4.6    Discharge instruction: per After Visit Summary and "Baby and Me Booklet".  After visit meds:  Allergies as of 03/07/2018   No Known Allergies     Medication List    TAKE these medications   HYDROmorphone 2 MG tablet Commonly known as:  DILAUDID Take 1 tablet (2 mg total) by mouth every 12 (twelve) hours as needed for severe pain.   ibuprofen 600 MG tablet Commonly known as:  ADVIL,MOTRIN Take 1 tablet (600 mg total) by mouth every 6 (six) hours as needed for mild pain, moderate pain or cramping.   PREPLUS 27-1 MG Tabs Take 1 tablet by mouth daily before breakfast.       Diet: routine diet  Activity: Advance as tolerated. Pelvic rest for 6 weeks.   Outpatient follow up:6 weeks Follow up Appt:No future appointments. Follow up Visit:No follow-ups on file.  Postpartum contraception: Undecided  Newborn Data: Live born  female  Birth Weight: 8 lb 4.6 oz (3760 g) APGAR: 9, 9  Newborn Delivery   Birth date/time:  03/04/2018 12:56:00 Delivery type:  C-Section, Low Transverse Trial of labor:  No C-section categorization:  Repeat     Baby Feeding: Breast Disposition:home with mother   03/07/2018 Janeece RiggersEllis K Jamere Stidham, CNM

## 2018-03-07 NOTE — Discharge Instructions (Signed)
Postpartum Care After Cesarean Delivery °The period of time right after you deliver your newborn is called the postpartum period. °What kind of medical care will I receive? °· You may continue to receive fluids and medicines through an IV tube inserted into one of your veins. °· You may have small, flexible tube (catheter) draining urine from your bladder into a bag outside of your body. The catheter will be removed as soon as possible. °· You may be given a squirt bottle to use when you go to the bathroom. You may use this until you are comfortable wiping as usual. To use the squirt bottle, follow these steps: °? Before you urinate, fill the squirt bottle with warm water. The water should be warm. Do not use hot water. °? After you urinate, while you are sitting on the toilet, use the squirt bottle to rinse the area around your urethra and vaginal opening. This rinses away any urine and blood. °? You may do this instead of wiping. As you start healing, you may use the squirt bottle before wiping yourself. Make sure to wipe gently. °? Fill the squirt bottle with clean water every time you use the bathroom. °· You will be given sanitary pads to wear. °· Your incision will be monitored to make sure it is healing properly. You will be told when it is safe for your stitches, staples, or skin adhesive tape to be removed. °What can I expect? °· You may not feel the need to urinate for several hours after delivery. °· You will have some soreness and pain in your abdomen. You may have a small amount of blood or clear fluid coming from your incision. °· If you are breastfeeding, you may have uterine contractions every time you breastfeed for up to several weeks postpartum. Uterine contractions help your uterus return to its normal size. °· It is normal to have vaginal bleeding (lochia) after delivery. The amount and appearance of lochia is often similar to a menstrual period in the first week after delivery. It will  gradually decrease over the next few weeks to a dry, yellow-brown discharge. For most women, lochia stops completely by 6-8 weeks after delivery. Vaginal bleeding can vary from woman to woman. °· Within the first few days after delivery, you may have breast engorgement. This is when your breasts feel heavy, full, and uncomfortable. Your breasts may also throb and feel hard, tightly stretched, warm, and tender. After this occurs, you may have milk leaking from your breasts. Your health care provider can help you relieve discomfort due to breast engorgement. Breast engorgement should go away within a few days. °· You may feel more sad or worried than normal due to hormonal changes after delivery. These feelings should not last more than a few days. If these feelings do not go away after several days, speak with your health care provider. °How should I care for myself? °· Tell your health care provider if you have pain or discomfort. °· Drink enough water to keep your urine clear or pale yellow. °· Wash your hands thoroughly with soap and water for at least 20 seconds after changing your sanitary pads or using the toilet, and before holding or feeding your baby. °· If you are not breastfeeding, avoid touching your breasts a lot. Doing this can make your breasts produce more milk. °· If you become weak or lightheaded, or you feel like you might faint, ask for help before: °? Getting out of bed. °? Showering. °·   Change your sanitary pads frequently. Watch for any changes in your flow, such as a sudden increase in volume, a change in color, or the passing of large blood clots. If you pass a blood clot from your vagina, save it to show to your health care provider. Do not flush blood clots down the toilet without having your health care provider look at them. °· Make sure that all your vaccinations are up to date. This can help protect you and your baby from getting certain diseases. You may need to have immunizations done  before you leave the hospital. °· If desired, talk with your health care provider about methods of family planning or birth control (contraception). °How can I start bonding with my baby? °Spending as much time as possible with your baby is very important. During this time, you and your baby can get to know each other and develop a bond. Having your baby stay with you in your room (rooming in) can give you time to get to know your baby. Rooming in can also help you become comfortable caring for your baby. Breastfeeding can also help you bond with your baby. °How can I plan for returning home with my baby? °· Make sure that you have a car seat installed in your vehicle. °? Your car seat should be checked by a certified car seat installer to make sure that it is installed safely. °? Make sure that your baby fits into the car seat safely. °· Ask your health care provider any questions you have about caring for yourself or your baby. Make sure that you are able to contact your health care provider with any questions after leaving the hospital. °This information is not intended to replace advice given to you by your health care provider. Make sure you discuss any questions you have with your health care provider. °Document Released: 05/15/2012 Document Revised: 01/24/2016 Document Reviewed: 07/26/2015 °Elsevier Interactive Patient Education © 2018 [employer] ° ° °Postpartum Depression and Baby Blues °The postpartum period begins right after the birth of a baby. During this time, there is often a great amount of joy and excitement. It is also a time of many changes in the life of the parents. Regardless of how many times a mother gives birth, each child brings new challenges and dynamics to the family. It is not unusual to have feelings of excitement along with confusing shifts in moods, emotions, and thoughts. All mothers are at risk of developing postpartum depression or the "baby blues." These mood changes can occur  right after giving birth, or they may occur many months after giving birth. The baby blues or postpartum depression can be mild or severe. Additionally, postpartum depression can go away rather quickly, or it can be a long-term condition. °What are the causes? °Raised hormone levels and the rapid drop in those levels are thought to be a main cause of postpartum depression and the baby blues. A number of hormones change during and after pregnancy. Estrogen and progesterone usually decrease right after the delivery of your baby. The levels of thyroid hormone and various cortisol steroids also rapidly drop. Other factors that play a role in these mood changes include major life events and genetics. °What increases the risk? °If you have any of the following risks for the baby blues or postpartum depression, know what symptoms to watch out for during the postpartum period. Risk factors that may increase the likelihood of getting the baby blues or postpartum depression include: °·   Having a personal or family history of depression. °· Having depression while being pregnant. °· Having premenstrual mood issues or mood issues related to oral contraceptives. °· Having a lot of life stress. °· Having marital conflict. °· Lacking a social support network. °· Having a baby with special needs. °· Having health problems, such as diabetes. ° °What are the signs or symptoms? °Symptoms of baby blues include: °· Brief changes in mood, such as going from extreme happiness to sadness. °· Decreased concentration. °· Difficulty sleeping. °· Crying spells, tearfulness. °· Irritability. °· Anxiety. ° °Symptoms of postpartum depression typically begin within the first month after giving birth. These symptoms include: °· Difficulty sleeping or excessive sleepiness. °· Marked weight loss. °· Agitation. °· Feelings of worthlessness. °· Lack of interest in activity or food. ° °Postpartum psychosis is a very serious condition and can be  dangerous. Fortunately, it is rare. Displaying any of the following symptoms is cause for immediate medical attention. Symptoms of postpartum psychosis include: °· Hallucinations and delusions. °· Bizarre or disorganized behavior. °· Confusion or disorientation. ° °How is this diagnosed? °A diagnosis is made by an evaluation of your symptoms. There are no medical or lab tests that lead to a diagnosis, but there are various questionnaires that a health care provider may use to identify those with the baby blues, postpartum depression, or psychosis. Often, a screening tool called the Edinburgh Postnatal Depression Scale is used to diagnose depression in the postpartum period. °How is this treated? °The baby blues usually goes away on its own in 1-2 weeks. Social support is often all that is needed. You will be encouraged to get adequate sleep and rest. Occasionally, you may be given medicines to help you sleep. °Postpartum depression requires treatment because it can last several months or longer if it is not treated. Treatment may include individual or group therapy, medicine, or both to address any social, physiological, and psychological factors that may play a role in the depression. Regular exercise, a healthy diet, rest, and social support may also be strongly recommended. °Postpartum psychosis is more serious and needs treatment right away. Hospitalization is often needed. °Follow these instructions at home: °· Get as much rest as you can. Nap when the baby sleeps. °· Exercise regularly. Some women find yoga and walking to be beneficial. °· Eat a balanced and nourishing diet. °· Do little things that you enjoy. Have a cup of tea, take a bubble bath, read your favorite magazine, or listen to your favorite music. °· Avoid alcohol. °· Ask for help with household chores, cooking, grocery shopping, or running errands as needed. Do not try to do everything. °· Talk to people close to you about how you are feeling.  Get support from your partner, family members, friends, or other new moms. °· Try to stay positive in how you think. Think about the things you are grateful for. °· Do not spend a lot of time alone. °· Only take over-the-counter or prescription medicine as directed by your health care provider. °· Keep all your postpartum appointments. °· Let your health care provider know if you have any concerns. °Contact a health care provider if: °You are having a reaction to or problems with your medicine. °Get help right away if: °· You have suicidal feelings. °· You think you may harm the baby or someone else. °This information is not intended to replace advice given to you by your health care provider. Make sure you discuss any questions you have   with your health care provider. °Document Released: 05/25/2004 Document Revised: 01/27/2016 Document Reviewed: 06/02/2013 °Elsevier Interactive Patient Education © 2017 [employer] Inc. ° °

## 2018-03-07 NOTE — Plan of Care (Signed)
Progressing appropriately. Encouraged to call for assistance as needed.  

## 2018-03-07 NOTE — Lactation Note (Signed)
This note was copied from a baby's chart. Lactation Consultation Note  Patient Name: Elizabeth Viann ShoveDamaris Betancourt-Serrano ZOXWR'UToday's Date: 03/07/2018 Reason for consult: Follow-up assessment Baby is 8168 hours old.  Mom reports that baby is latching easily to breast.  She just pumped 15 mls of transitional milk.  Breasts are full but no engorgement.  Mom has a Medela DEBP at home.  Stressed importance of pumping anytime she gives baby a bottle.  Questions answered.  Lactation services and support information reviewed and encouraged prn.  Maternal Data    Feeding Feeding Type: Formula Nipple Type: Slow - flow  LATCH Score                   Interventions    Lactation Tools Discussed/Used     Consult Status Consult Status: Complete Follow-up type: Call as needed    Huston FoleyMOULDEN, Taber Sweetser S 03/07/2018, 9:07 AM

## 2018-03-21 ENCOUNTER — Encounter (HOSPITAL_COMMUNITY): Payer: Self-pay

## 2018-03-21 ENCOUNTER — Other Ambulatory Visit: Payer: Self-pay

## 2018-03-21 ENCOUNTER — Inpatient Hospital Stay (HOSPITAL_COMMUNITY): Payer: 59

## 2018-03-21 ENCOUNTER — Inpatient Hospital Stay (HOSPITAL_COMMUNITY)
Admission: AD | Admit: 2018-03-21 | Discharge: 2018-03-21 | Disposition: A | Discharge status: Home / Self Care | Payer: 59 | Source: Ambulatory Visit | Attending: Obstetrics and Gynecology | Admitting: Obstetrics and Gynecology

## 2018-03-21 DIAGNOSIS — R1032 Left lower quadrant pain: Secondary | ICD-10-CM | POA: Insufficient documentation

## 2018-03-21 DIAGNOSIS — R9389 Abnormal findings on diagnostic imaging of other specified body structures: Secondary | ICD-10-CM | POA: Diagnosis not present

## 2018-03-21 DIAGNOSIS — R109 Unspecified abdominal pain: Secondary | ICD-10-CM

## 2018-03-21 DIAGNOSIS — Z9889 Other specified postprocedural states: Secondary | ICD-10-CM | POA: Insufficient documentation

## 2018-03-21 LAB — CBC
HCT: 35.9 % — ABNORMAL LOW (ref 36.0–46.0)
Hemoglobin: 11.4 g/dL — ABNORMAL LOW (ref 12.0–15.0)
MCH: 27.5 pg (ref 26.0–34.0)
MCHC: 31.8 g/dL (ref 30.0–36.0)
MCV: 86.5 fL (ref 78.0–100.0)
Platelets: 242 10*3/uL (ref 150–400)
RBC: 4.15 MIL/uL (ref 3.87–5.11)
RDW: 14.6 % (ref 11.5–15.5)
WBC: 6.9 10*3/uL (ref 4.0–10.5)

## 2018-03-21 MED ORDER — OXYCODONE-ACETAMINOPHEN 5-325 MG PO TABS
1.0000 | ORAL_TABLET | Freq: Once | ORAL | Status: AC
Start: 2018-03-21 — End: 2018-03-21
  Administered 2018-03-21: 1 via ORAL
  Filled 2018-03-21: qty 1

## 2018-03-21 MED ORDER — OXYCODONE-ACETAMINOPHEN 5-325 MG PO TABS: 1.0000 | ORAL_TABLET | ORAL | 0 refills | Status: AC | PRN

## 2018-03-21 NOTE — Discharge Instructions (Signed)
Acetaminophen; Oxycodone tablets What is this medicine? ACETAMINOPHEN; OXYCODONE (a set a MEE noe fen; ox i KOE done) is a pain reliever. It is used to treat moderate to severe pain. This medicine may be used for other purposes; ask your health care provider or pharmacist if you have questions. COMMON BRAND NAME(S): Endocet, Magnacet, Narvox, Percocet, Perloxx, Primalev, Primlev, Roxicet, Xolox What should I tell my health care provider before I take this medicine? They need to know if you have any of these conditions: -brain tumor -Crohn's disease, inflammatory bowel disease, or ulcerative colitis -drug abuse or addiction -head injury -heart or circulation problems -if you often drink alcohol -kidney disease or problems going to the bathroom -liver disease -lung disease, asthma, or breathing problems -an unusual or allergic reaction to acetaminophen, oxycodone, other opioid analgesics, other medicines, foods, dyes, or preservatives -pregnant or trying to get pregnant -breast-feeding How should I use this medicine? Take this medicine by mouth with a full glass of water. Follow the directions on the prescription label. You can take it with or without food. If it upsets your stomach, take it with food. Take your medicine at regular intervals. Do not take it more often than directed. A special MedGuide will be given to you by the pharmacist with each prescription and refill. Be sure to read this information carefully each time. Talk to your pediatrician regarding the use of this medicine in children. Special care may be needed. Overdosage: If you think you have taken too much of this medicine contact a poison control center or emergency room at once. NOTE: This medicine is only for you. Do not share this medicine with others. What if I miss a dose? If you miss a dose, take it as soon as you can. If it is almost time for your next dose, take only that dose. Do not take double or extra  doses. What may interact with this medicine? This medicine may interact with the following medications: -alcohol -antihistamines for allergy, cough and cold -antiviral medicines for HIV or AIDS -atropine -certain antibiotics like clarithromycin, erythromycin, linezolid, rifampin -certain medicines for anxiety or sleep -certain medicines for bladder problems like oxybutynin, tolterodine -certain medicines for depression like amitriptyline, fluoxetine, sertraline -certain medicines for fungal infections like ketoconazole, itraconazole, voriconazole -certain medicines for migraine headache like almotriptan, eletriptan, frovatriptan, naratriptan, rizatriptan, sumatriptan, zolmitriptan -certain medicines for nausea or vomiting like dolasetron, ondansetron, palonosetron -certain medicines for Parkinson's disease like benztropine, trihexyphenidyl -certain medicines for seizures like phenobarbital, phenytoin, primidone -certain medicines for stomach problems like dicyclomine, hyoscyamine -certain medicines for travel sickness like scopolamine -diuretics -general anesthetics like halothane, isoflurane, methoxyflurane, propofol -ipratropium -local anesthetics like lidocaine, pramoxine, tetracaine -MAOIs like Carbex, Eldepryl, Marplan, Nardil, and Parnate -medicines that relax muscles for surgery -methylene blue -nilotinib -other medicines with acetaminophen -other narcotic medicines for pain or cough -phenothiazines like chlorpromazine, mesoridazine, prochlorperazine, thioridazine This list may not describe all possible interactions. Give your health care provider a list of all the medicines, herbs, non-prescription drugs, or dietary supplements you use. Also tell them if you smoke, drink alcohol, or use illegal drugs. Some items may interact with your medicine. What should I watch for while using this medicine? Tell your doctor or health care professional if your pain does not go away, if it  gets worse, or if you have new or a different type of pain. You may develop tolerance to the medicine. Tolerance means that you will need a higher dose of the medication for pain relief.  Tolerance is normal and is expected if you take this medicine for a long time. Do not suddenly stop taking your medicine because you may develop a severe reaction. Your body becomes used to the medicine. This does NOT mean you are addicted. Addiction is a behavior related to getting and using a drug for a non-medical reason. If you have pain, you have a medical reason to take pain medicine. Your doctor will tell you how much medicine to take. If your doctor wants you to stop the medicine, the dose will be slowly lowered over time to avoid any side effects. There are different types of narcotic medicines (opiates). If you take more than one type at the same time or if you are taking another medicine that also causes drowsiness, you may have more side effects. Give your health care provider a list of all medicines you use. Your doctor will tell you how much medicine to take. Do not take more medicine than directed. Call emergency for help if you have problems breathing or unusual sleepiness. Do not take other medicines that contain acetaminophen with this medicine. Always read labels carefully. If you have questions, ask your doctor or pharmacist. If you take too much acetaminophen get medical help right away. Too much acetaminophen can be very dangerous and cause liver damage. Even if you do not have symptoms, it is important to get help right away. You may get drowsy or dizzy. Do not drive, use machinery, or do anything that needs mental alertness until you know how this medicine affects you. Do not stand or sit up quickly, especially if you are an older patient. This reduces the risk of dizzy or fainting spells. Alcohol may interfere with the effect of this medicine. Avoid alcoholic drinks. The medicine will cause  constipation. Try to have a bowel movement at least every 2 to 3 days. If you do not have a bowel movement for 3 days, call your doctor or health care professional. Your mouth may get dry. Chewing sugarless gum or sucking hard candy, and drinking plenty or water may help. Contact your doctor if the problem does not go away or is severe. What side effects may I notice from receiving this medicine? Side effects that you should report to your doctor or health care professional as soon as possible: -allergic reactions like skin rash, itching or hives, swelling of the face, lips, or tongue -breathing problems -confusion -redness, blistering, peeling or loosening of the skin, including inside the mouth -signs and symptoms of liver injury like dark yellow or brown urine; general ill feeling or flu-like symptoms; light-colored stools; loss of appetite; nausea; right upper belly pain; unusually weak or tired; yellowing of the eyes or skin -signs and symptoms of low blood pressure like dizziness; feeling faint or lightheaded, falls; unusually weak or tired -trouble passing urine or change in the amount of urine Side effects that usually do not require medical attention (report to your doctor or health care professional if they continue or are bothersome): -constipation -dry mouth -nausea, vomiting -tiredness This list may not describe all possible side effects. Call your doctor for medical advice about side effects. You may report side effects to FDA at 1-800-FDA-1088. Where should I keep my medicine? Keep out of the reach of children. This medicine can be abused. Keep your medicine in a safe place to protect it from theft. Do not share this medicine with anyone. Selling or giving away this medicine is dangerous and against the law. This  medicine may cause accidental overdose and death if it taken by other adults, children, or pets. Mix any unused medicine with a substance like cat litter or coffee grounds.  Then throw the medicine away in a sealed container like a sealed bag or a coffee can with a lid. Do not use the medicine after the expiration date. Store at room temperature between 20 and 25 degrees C (68 and 77 degrees F). NOTE: This sheet is a summary. It may not cover all possible information. If you have questions about this medicine, talk to your doctor, pharmacist, or health care provider.  2018 Elsevier/Gold Standard (2015-05-17 21:48:12) Postpartum Care After Cesarean Delivery The period of time right after you deliver your newborn is called the postpartum period. What kind of medical care will I receive?  You may continue to receive fluids and medicines through an IV tube inserted into one of your veins.  You may have small, flexible tube (catheter) draining urine from your bladder into a bag outside of your body. The catheter will be removed as soon as possible.  You may be given a squirt bottle to use when you go to the bathroom. You may use this until you are comfortable wiping as usual. To use the squirt bottle, follow these steps: ? Before you urinate, fill the squirt bottle with warm water. The water should be warm. Do not use hot water. ? After you urinate, while you are sitting on the toilet, use the squirt bottle to rinse the area around your urethra and vaginal opening. This rinses away any urine and blood. ? You may do this instead of wiping. As you start healing, you may use the squirt bottle before wiping yourself. Make sure to wipe gently. ? Fill the squirt bottle with clean water every time you use the bathroom.  You will be given sanitary pads to wear.  Your incision will be monitored to make sure it is healing properly. You will be told when it is safe for your stitches, staples, or skin adhesive tape to be removed. What can I expect?  You may not feel the need to urinate for several hours after delivery.  You will have some soreness and pain in your abdomen. You  may have a small amount of blood or clear fluid coming from your incision.  If you are breastfeeding, you may have uterine contractions every time you breastfeed for up to several weeks postpartum. Uterine contractions help your uterus return to its normal size.  It is normal to have vaginal bleeding (lochia) after delivery. The amount and appearance of lochia is often similar to a menstrual period in the first week after delivery. It will gradually decrease over the next few weeks to a dry, yellow-brown discharge. For most women, lochia stops completely by 6-8 weeks after delivery. Vaginal bleeding can vary from woman to woman.  Within the first few days after delivery, you may have breast engorgement. This is when your breasts feel heavy, full, and uncomfortable. Your breasts may also throb and feel hard, tightly stretched, warm, and tender. After this occurs, you may have milk leaking from your breasts.Your health care provider can help you relieve discomfort due to breast engorgement. Breast engorgement should go away within a few days.  You may feel more sad or worried than normal due to hormonal changes after delivery. These feelings should not last more than a few days. If these feelings do not go away after several days, speak with your health  care provider. How should I care for myself?  Tell your health care provider if you have pain or discomfort.  Drink enough water to keep your urine clear or pale yellow.  Wash your hands thoroughly with soap and water for at least 20 seconds after changing your sanitary pads or using the toilet, and before holding or feeding your baby.  If you are not breastfeeding, avoid touching your breasts a lot. Doing this can make your breasts produce more milk.  If you become weak or lightheaded, or you feel like you might faint, ask for help before: ? Getting out of bed. ? Showering.  Change your sanitary pads frequently. Watch for any changes in your  flow, such as a sudden increase in volume, a change in color, or the passing of large blood clots. If you pass a blood clot from your vagina, save it to show to your health care provider. Do not flush blood clots down the toilet without having your health care provider look at them.  Make sure that all your vaccinations are up to date. This can help protect you and your baby from getting certain diseases. You may need to have immunizations done before you leave the hospital.  If desired, talk with your health care provider about methods of family planning or birth control (contraception). How can I start bonding with my baby? Spending as much time as possible with your baby is very important. During this time, you and your baby can get to know each other and develop a bond. Having your baby stay with you in your room (rooming in) can give you time to get to know your baby. Rooming in can also help you become comfortable caring for your baby. Breastfeeding can also help you bond with your baby. How can I plan for returning home with my baby?  Make sure that you have a car seat installed in your vehicle. ? Your car seat should be checked by a certified car seat installer to make sure that it is installed safely. ? Make sure that your baby fits into the car seat safely.  Ask your health care provider any questions you have about caring for yourself or your baby. Make sure that you are able to contact your health care provider with any questions after leaving the hospital. This information is not intended to replace advice given to you by your health care provider. Make sure you discuss any questions you have with your health care provider. Document Released: 05/15/2012 Document Revised: 01/24/2016 Document Reviewed: 07/26/2015 Elsevier Interactive Patient Education  [employer].

## 2018-03-21 NOTE — MAU Provider Note (Addendum)
History     CSN: [id number]  Arrival date & time 03/21/18  1142   First Provider Initiated Contact with Patient 03/21/18 1220      Chief Complaint  Patient presents with  . Abdominal Pain    Elizabeth Acevedo 37 y.o. [id number] . Had C/S on 03/04/18.     History reviewed. No pertinent past medical history.  Past Surgical History:  Procedure Laterality Date  . APPENDECTOMY    . CESAREAN SECTION    . CESAREAN SECTION N/A 03/04/2018   Procedure: REPEAT CESAREAN SECTION;  Surgeon: Jaymes Graff, MD;  Location: [facility];  Service: Obstetrics;  Laterality: N/A;  RNFA requested  . TUBAL LIGATION Bilateral 03/04/2018   Procedure: BILATERAL TUBAL LIGATION;  Surgeon: Jaymes Graff, MD;  Location: [facility];  Service: Obstetrics;  Laterality: Bilateral;  . WISDOM TOOTH EXTRACTION      History reviewed. No pertinent family history.  Social History   Tobacco Use  . Smoking status: Never Smoker  . Smokeless tobacco: Never Used  Substance Use Topics  . Alcohol use: Never    Frequency: Never  . Drug use: Never    OB History    Gravida  3   Para  3   Term  3   Preterm      AB      Living  3     SAB      TAB      Ectopic      Multiple  0   Live Births  3          Op Report Preoperative diagnosis : term desires repeat cesarean section and bilateral salpingectomy Postoperative diagnosis: Is the same with postpartum hemorrhage and uterine atony. Procedure is repeat lower transverse cesarean section and bilateral salpingectomy Repair bleeding mesal salpinx Surgeon is Dr. Normand Sloop Anesthesia spinal and then general Assistants:  Peggyann Juba CMA and Dr. Debroah Loop M.D. EBL:  1716cc UO 200 mL clear urine IV fluids 2700 mL crystalloid Complications were bleeding in the meso- salpinx and uterine atony Procedure in detail: Patient was taken to the operating room prepped and draped in the normal sterile fashion. SCD hose and Foley catheter was  placed. Patient received 2 g of Ancef. A Pfannenstiel skin incision was made with the knife over her previous incision and carried down to the fascia. Fascia was incised in the midline and extended bilaterally using Mayo scissors. kokers times two placed on the superior aspect of the fascia which was dissected off the rectus muscle sharply using Mayo scissors. Lower  Aspect of the fascia was dissected in a similar fashion. Peritoneum was identified tented up with hemostats and entered sharply with Metzenbaum scissors and extended superiorly and inferiorly with good visualization of bowel and bladder. The Jon Gills was placed there were no adhesions holding it up. Bladder flap was created with Metzenbaum scissors. A low transverse uterine incision was made with scalpel and extended bluntly. Clear fluid was noted. Infant was delivered without difficulty  cord around the feet. apgars  were 9 and 9 placenta was delivered without difficulty the uterus was cleared of all clot and debris. Uterine incision was closed with 0 Vicryl in a running lock fashion. Second layer of 0 Vicryl was used to imbricate the uterus. The patient's left fallopian tube was grasped with Babcock clamps. And followed to the uterus. Using Kelly's along the tube. The meso-l salpinx was sutured with 2-0 Vicryl. And the tube was removed with Metzenbaum scissors. Hemostasis was  noted. The patient's right fallopian tube was grasped in a similar fashion.After the tube was removed there was noted to be bleeding in the mesal salpinx. Was clamped and made hemostatic with figure-of-eight 2-0 Vicryl suture. The area continued to bleed. In the areas on the uterus started to bleed where sutures were placed. After placing several sutures using Bovie cautery and Arista I asked  for another assistant for help. . Dr. Debroah LoopArnold came to assist.  several figure-of-eight sutures were placed around the bleeding area on the right side of the uterus is right fundal and right  mesal salpinx.He is also noted to be boggy. The patient was given Hemabate and Pitocin IV and inside the uterus methergine and a TX 8. Finally after several more sutures were placed in the medicines kicked in hemostasis was noted.The uterus had been exteriorized once the mesal salpinx started bleeding. To extend the fascial incisions seem to placed uterus back into the abdominal cavity. Irrigation was done and hemostasis noted in the final areas of interest were placed along the uterine incision and along the area that was bleeding. The peritoneum was closed with 0 chromic. The muscles were irrigated and noted to be hemostatic. Fascia was closed with 0 right Vicryl in 2 using 2 suture. The subcutaneous tissue was irrigated and noted to be hemostatic. Hopefully was used to reaproximate the subcutaneous tissue.It was closed with 3-0 Monocryl subcutaneous fashion.  During the procedure the patient's hemoglobin was checked and is still said to be 11 which we felt like she was dry she was given extra fluid and albumin she remained stable throughout the entire case. Check hemoglobin in PACU and another one 4 hours from now.We will watch the patient very closely she also will be given another 2 g of Ancef physical blood loss.   Review of Systems  Gastrointestinal: Positive for abdominal pain.       Specifically on left side; said it started 2 days ago; reports BM's  All other systems reviewed and are negative.   Allergies  Patient has no known allergies.  Home Medications    BP 109/69 (BP Location: Right Arm)   Pulse 70   Temp 98.6 F (37 C) (Oral)   Resp 16   Wt 151 lb 4 oz (68.6 kg)   Breastfeeding? Yes Comment: baby is doing well  BMI 29.54 kg/m   Physical Exam  MAU Course  Procedures (including critical care time)  Labs Reviewed  CBC   [place] Pelvic Complete With Transvaginal  Result Date: 03/21/2018 CLINICAL DATA:  Recent postpartum. C-section delivery 03/04/2018. Left lower quadrant  pain. EXAM: TRANSABDOMINAL AND TRANSVAGINAL ULTRASOUND OF PELVIS TECHNIQUE: Both transabdominal and transvaginal ultrasound examinations of the pelvis were performed. Transabdominal technique was performed for global imaging of the pelvis including uterus, ovaries, adnexal regions, and pelvic cul-de-sac. It was necessary to proceed with endovaginal exam following the transabdominal exam to visualize the uterus, endometrium, ovaries and adnexa. COMPARISON:  None FINDINGS: Uterus Measurements: 14.2 x 8.2 x 9.8 cm. No fibroids or other mass visualized. Endometrium Thickness: Thickened, measuring 37 mm in thickness. Heterogeneous material and fluid noted within the endometrial canal, presumably blood products. No internal blood flow noted. Right ovary Measurements: 3.6 x 2.2 x 2.2 cm. Normal appearance/no adnexal mass. Left ovary Measurements: 3.8 x 1.9 x 2.1 cm. Normal appearance/no adnexal mass. Other findings Trace free fluid in the pelvis. IMPRESSION: Thickened endometrium. Heterogeneous soft tissue and complex fluid noted within the endometrial canal, presumably blood products. No definite  internal blood flow to suggest retained products of conception. Electronically Signed   By: Charlett Nose M.D.   On: 03/21/2018 14:41     1. Abdominal pain       MDM  Pt's U/S is negative and pt wants to go home. She was given a percocet earlier and got relief from her pain. I have ordered a cbc per Dr. Su Hilt and Discussed pt with Dr. Su Hilt. Pt has not been seen for an incision check at the office. I checked her incicison and it is well approximated without redness or drainage, I told pt to call office if pain continues or worsens. RX: Percocet 5/325 #20    CBC Latest Ref Rng & Units 03/21/2018 03/05/2018 03/04/2018  WBC 4.0 - 10.5 K/uL 6.9 13.4(H) 16.4(H)  Hemoglobin 12.0 - 15.0 g/dL 11.4(L) 9.8(L) 11.4(L)  Hematocrit 36.0 - 46.0 % 35.9(L) 30.2(L) 34.9(L)  Platelets 150 - 400 K/uL 242 171 159

## 2018-03-21 NOTE — MAU Note (Signed)
Still in pain, pain is on LLQ. Had c/s and tubal on 7/1. ? Problem during surgery- bled on left side and that is where the pain is. was dc'd with  Hydrocodone, ins only would allow for 10, so the Dr sent over rx last wk for additional.  Pain goes away when she takes Ibuprofen, but comes back and is worse last couple days

## 2019-01-13 IMAGING — US US PELVIS COMPLETE TRANSABD/TRANSVAG
1 series · 15 of 25 positions shown · non-contrast
Comparison: None

CLINICAL DATA: Recent postpartum. C-section delivery 03/04/2018.
Left lower quadrant pain.



[Series 2: us pelvis complete transabd/transvag · 85 acquisitions, 15 frames shown]
[im 1/85]
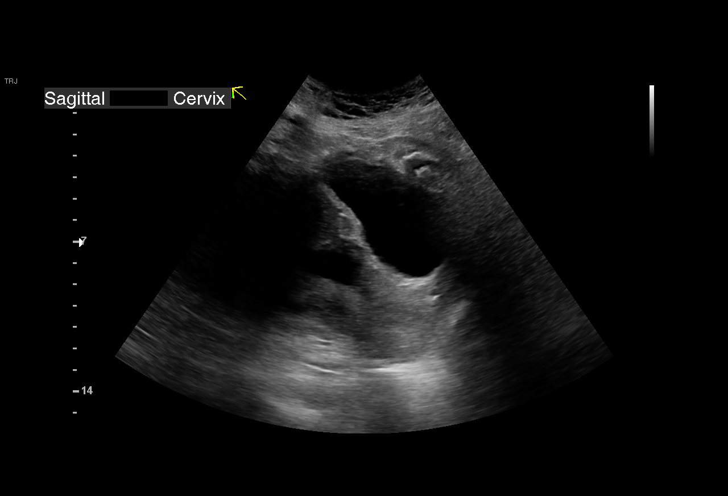
[im 8/85]
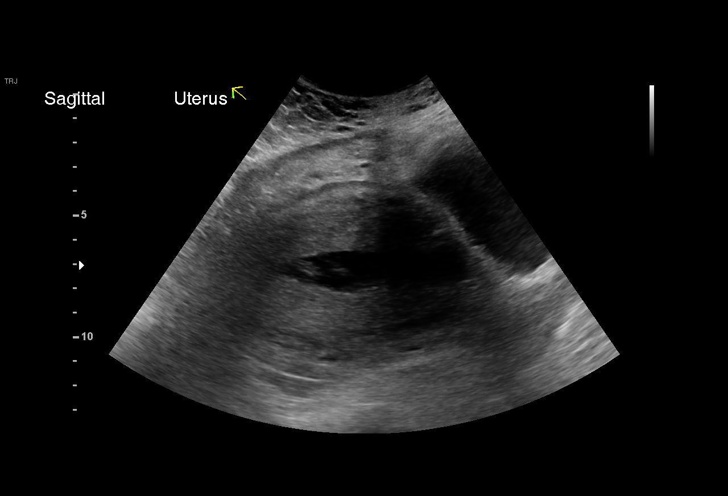
[im 15/85]
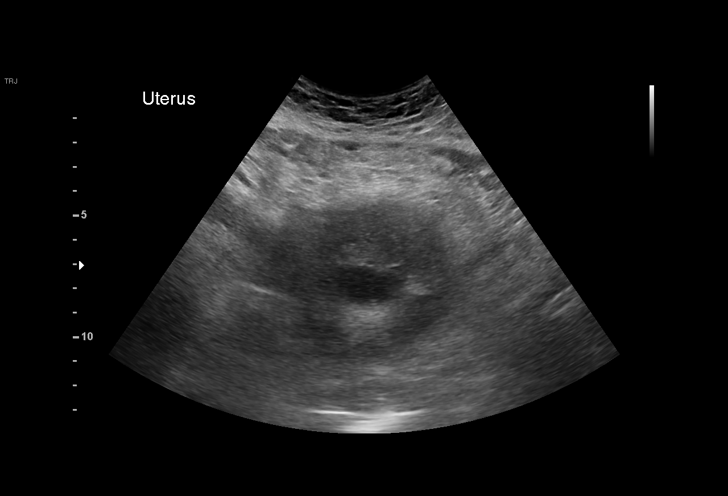
[im 18/85]
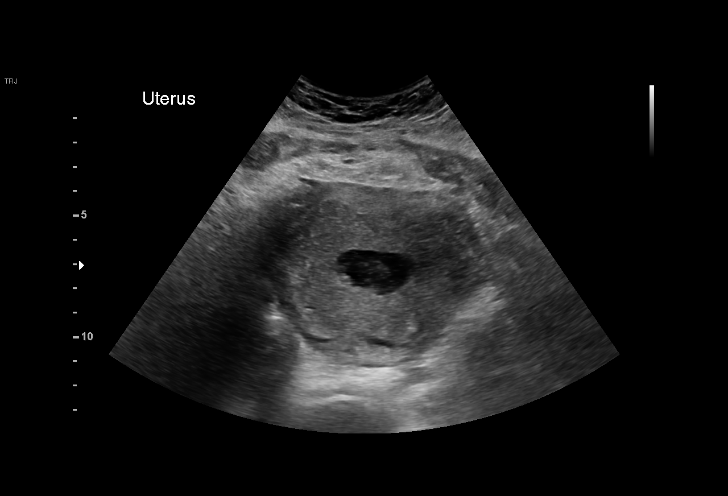
[im 25/85]
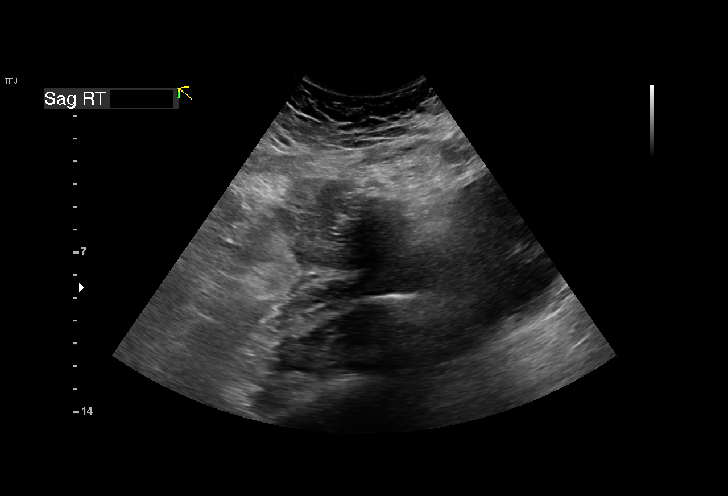
[im 32/85]
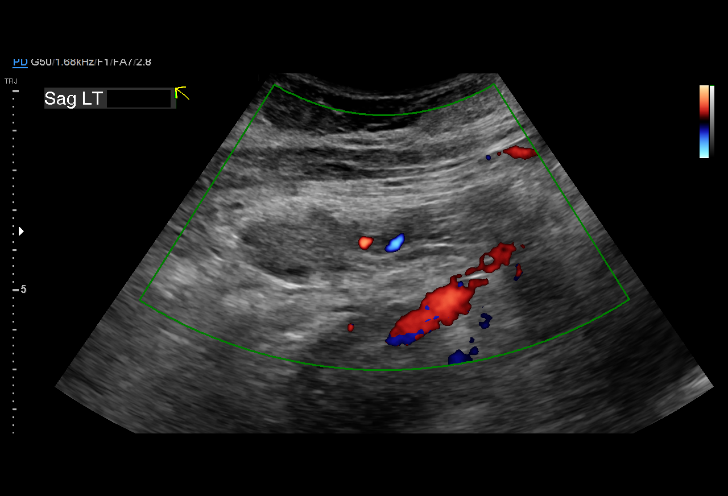
[im 36/85]
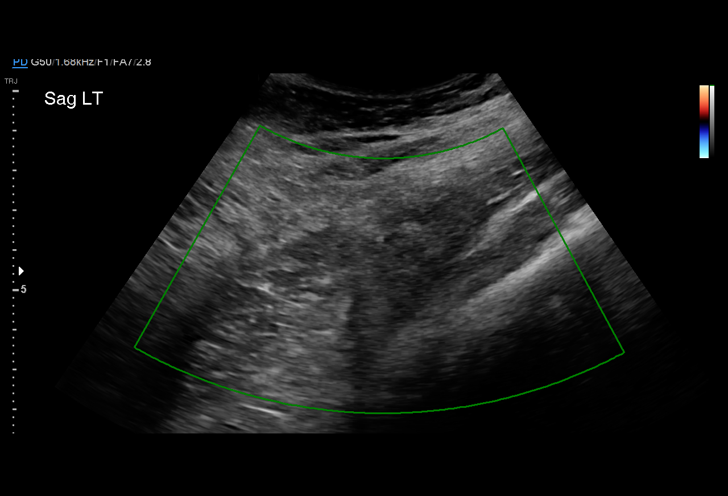
[im 43/85]
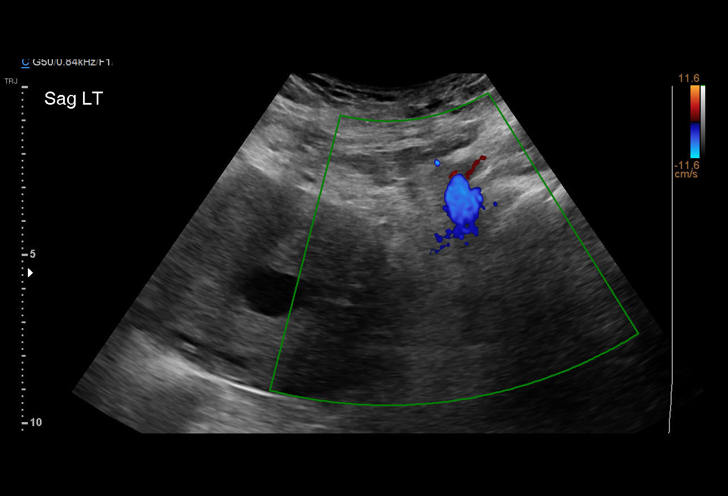
[im 50/85]
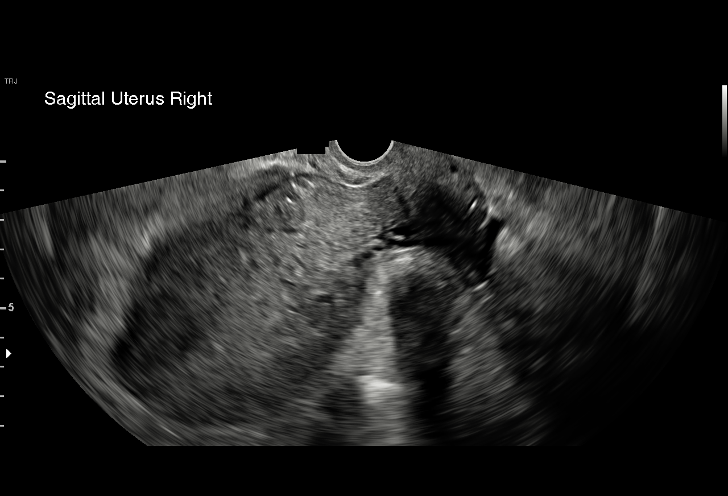
[im 53/85]
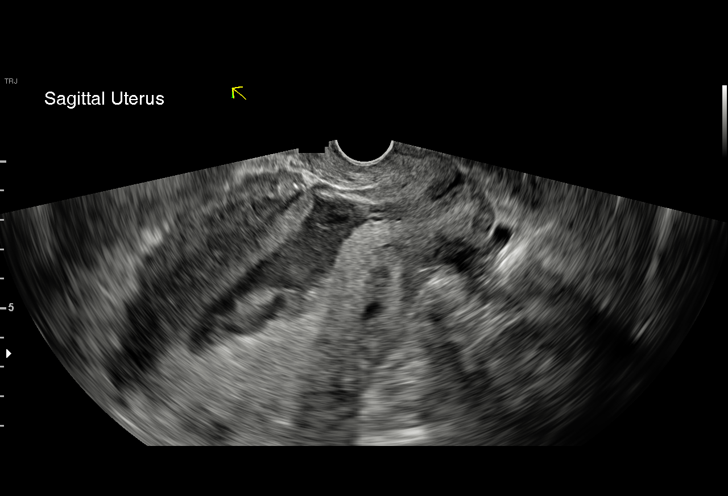
[im 60/85]
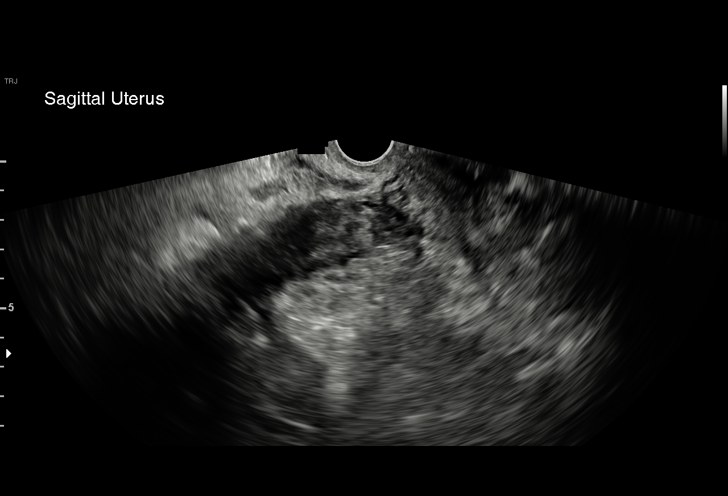
[im 67/85]
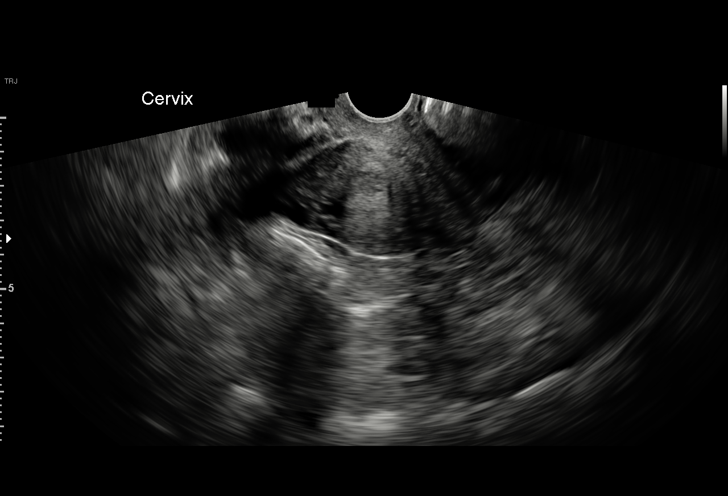
[im 71/85]
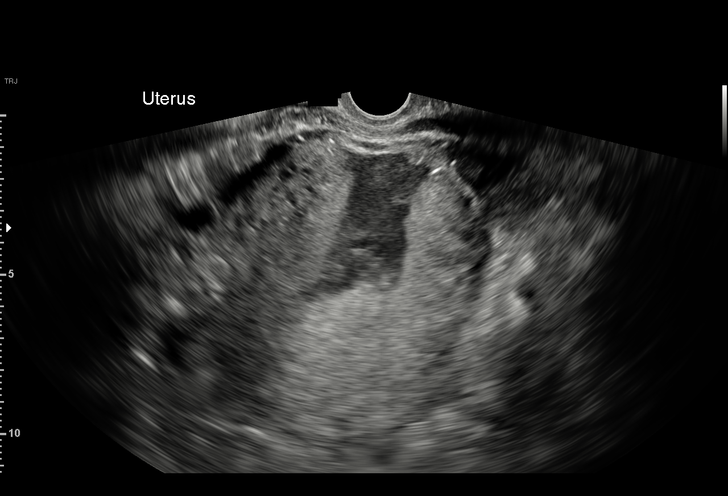
[im 78/85]
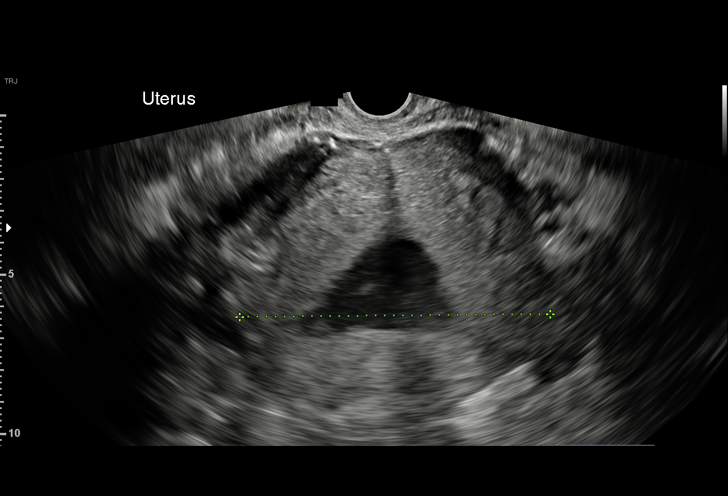
[im 85/85]
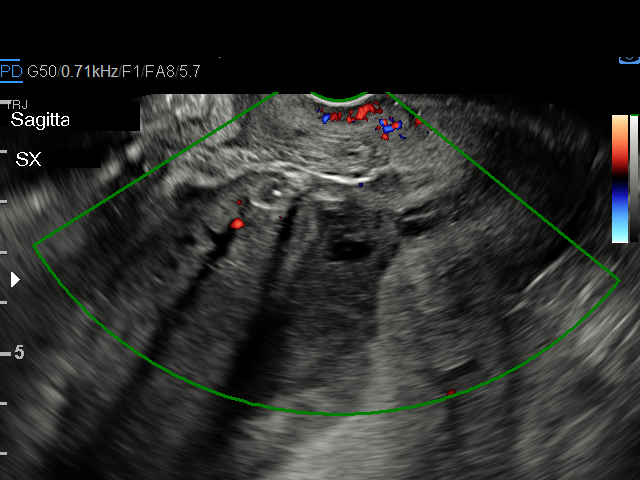

[15 of 25 positions shown; findings below may reference images not displayed]

FINDINGS: Uterus

Measurements: 14.2 x 8.2 x 9.8 cm. No fibroids or other mass
visualized.

Endometrium

Thickness: Thickened, measuring 37 mm in thickness. Heterogeneous
material and fluid noted within the endometrial canal, presumably
blood products. No internal blood flow noted..

Right ovary

Measurements: 3.6 x 2.2 x 2.2 cm. Normal appearance/no adnexal mass.

Left ovary

Measurements: 3.8 x 1.9 x 2.1 cm. Normal appearance/no adnexal mass.

Other findings

Trace free fluid in the pelvis.
IMPRESSION: Thickened endometrium. Heterogeneous soft tissue and complex fluid
noted within the endometrial canal, presumably blood products. No
definite internal blood flow to suggest retained products of
conception.

## 2023-06-13 ENCOUNTER — Other Ambulatory Visit: Payer: Self-pay | Admitting: Internal Medicine

## 2023-06-13 DIAGNOSIS — Z1231 Encounter for screening mammogram for malignant neoplasm of breast: Secondary | ICD-10-CM

## 2023-06-19 ENCOUNTER — Ambulatory Visit: Payer: Medicaid Other

## 2023-07-31 ENCOUNTER — Ambulatory Visit
Admission: RE | Admit: 2023-07-31 | Discharge: 2023-07-31 | Disposition: A | Discharge status: Home / Self Care | Payer: Medicaid Other | Source: Ambulatory Visit | Attending: Internal Medicine

## 2023-07-31 DIAGNOSIS — Z1231 Encounter for screening mammogram for malignant neoplasm of breast: Secondary | ICD-10-CM
